# Patient Record
Sex: Female | Born: 1995 | Race: White | Hispanic: No | Marital: Single | Smoking: Never smoker
Health system: Southern US, Community
[De-identification: ages and names within clinical notes are randomized; demographics above are authoritative.]

## PROBLEM LIST (undated history)

## (undated) DIAGNOSIS — G43909 Migraine, unspecified, not intractable, without status migrainosus: Secondary | ICD-10-CM

## (undated) HISTORY — DX: Migraine, unspecified, not intractable, without status migrainosus: G43.909

---
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
## 2016-08-16 ENCOUNTER — Encounter (HOSPITAL_BASED_OUTPATIENT_CLINIC_OR_DEPARTMENT_OTHER): Payer: Self-pay

## 2016-08-16 ENCOUNTER — Emergency Department (HOSPITAL_BASED_OUTPATIENT_CLINIC_OR_DEPARTMENT_OTHER): Payer: BLUE CROSS/BLUE SHIELD

## 2016-08-16 ENCOUNTER — Emergency Department (HOSPITAL_BASED_OUTPATIENT_CLINIC_OR_DEPARTMENT_OTHER)
Admission: EM | Admit: 2016-08-16 | Discharge: 2016-08-16 | Disposition: A | Discharge status: Home / Self Care | Payer: BLUE CROSS/BLUE SHIELD | Attending: Emergency Medicine | Admitting: Emergency Medicine

## 2016-08-16 DIAGNOSIS — R1032 Left lower quadrant pain: Secondary | ICD-10-CM | POA: Diagnosis present

## 2016-08-16 DIAGNOSIS — Z79899 Other long term (current) drug therapy: Secondary | ICD-10-CM | POA: Insufficient documentation

## 2016-08-16 DIAGNOSIS — N200 Calculus of kidney: Secondary | ICD-10-CM | POA: Insufficient documentation

## 2016-08-16 LAB — CBC
HCT: 36.4 % (ref 36.0–46.0)
HEMOGLOBIN: 12.2 g/dL (ref 12.0–15.0)
MCH: 27.5 pg (ref 26.0–34.0)
MCHC: 33.5 g/dL (ref 30.0–36.0)
MCV: 82 fL (ref 78.0–100.0)
PLATELETS: 253 10*3/uL (ref 150–400)
RBC: 4.44 MIL/uL (ref 3.87–5.11)
RDW: 13.1 % (ref 11.5–15.5)
WBC: 8 10*3/uL (ref 4.0–10.5)

## 2016-08-16 LAB — URINALYSIS, MICROSCOPIC (REFLEX)

## 2016-08-16 LAB — COMPREHENSIVE METABOLIC PANEL
ALK PHOS: 49 U/L (ref 38–126)
ALT: 14 U/L (ref 14–54)
AST: 18 U/L (ref 15–41)
Albumin: 4.2 g/dL (ref 3.5–5.0)
Anion gap: 11 (ref 5–15)
BUN: 10 mg/dL (ref 6–20)
CHLORIDE: 104 mmol/L (ref 101–111)
CO2: 23 mmol/L (ref 22–32)
CREATININE: 0.82 mg/dL (ref 0.44–1.00)
Calcium: 8.9 mg/dL (ref 8.9–10.3)
GFR calc Af Amer: >60 mL/min (ref >60)
GFR calc non Af Amer: >60 mL/min (ref >60)
GLUCOSE: 131 mg/dL — AB (ref 65–99)
Potassium: 3.5 mmol/L (ref 3.5–5.1)
SODIUM: 138 mmol/L (ref 135–145)
Total Bilirubin: 0.4 mg/dL (ref 0.3–1.2)
Total Protein: 7.1 g/dL (ref 6.5–8.1)

## 2016-08-16 LAB — PREGNANCY, URINE: PREG TEST UR: NEGATIVE

## 2016-08-16 LAB — URINALYSIS, ROUTINE W REFLEX MICROSCOPIC
Bilirubin Urine: NEGATIVE
GLUCOSE, UA: NEGATIVE mg/dL
Ketones, ur: NEGATIVE mg/dL
Nitrite: NEGATIVE
PH: 5.5 (ref 5.0–8.0)
Protein, ur: NEGATIVE mg/dL
SPECIFIC GRAVITY, URINE: 1.025 (ref 1.005–1.030)

## 2016-08-16 LAB — LIPASE, BLOOD: Lipase: 30 U/L (ref 11–51)

## 2016-08-16 MED ORDER — HYDROCODONE-ACETAMINOPHEN 5-325 MG PO TABS: 1.0000 | ORAL_TABLET | Freq: Four times a day (QID) | ORAL | 0 refills | Status: DC | PRN

## 2016-08-16 MED ORDER — KETOROLAC TROMETHAMINE 30 MG/ML IJ SOLN
30.0000 mg | Freq: Once | INTRAMUSCULAR | Status: AC
  Administered 2016-08-16: 30 mg via INTRAVENOUS

## 2016-08-16 MED ORDER — IBUPROFEN 800 MG PO TABS: 800.0000 mg | ORAL_TABLET | Freq: Three times a day (TID) | ORAL | 0 refills | Status: DC | PRN

## 2016-08-16 MED ORDER — SODIUM CHLORIDE 0.9 % IV BOLUS (SEPSIS)
1000.0000 mL | Freq: Once | INTRAVENOUS | Status: AC
Start: 2016-08-16 — End: 2016-08-16
  Administered 2016-08-16: 1000 mL via INTRAVENOUS

## 2016-08-16 MED ORDER — TAMSULOSIN HCL 0.4 MG PO CAPS: 0.4000 mg | ORAL_CAPSULE | Freq: Every day | ORAL | 0 refills | Status: DC

## 2016-08-16 MED ORDER — ONDANSETRON HCL 4 MG/2ML IJ SOLN
4.0000 mg | Freq: Once | INTRAMUSCULAR | Status: AC
  Administered 2016-08-16: 4 mg via INTRAVENOUS

## 2016-08-16 MED ORDER — ONDANSETRON 4 MG PO TBDP: 4.0000 mg | ORAL_TABLET | Freq: Three times a day (TID) | ORAL | 0 refills | Status: DC | PRN

## 2016-08-16 NOTE — ED Notes (Signed)
Pt's mother came to desk to state pt was nauseated

## 2016-08-16 NOTE — ED Triage Notes (Signed)
Pt c/o left lower abdominal pain that woke her from sleep, denies dysuria, denies n/v/d

## 2016-08-16 NOTE — ED Notes (Signed)
Pt verbalizes understanding of d/c instructions and denies any further needs at this time. 

## 2016-08-16 NOTE — Discharge Instructions (Signed)
You are being provided a prescription for opiates (also known as narcotics) for pain control.  Opiates can be addictive and should only be used when absolutely necessary for pain control when other alternatives do not work.  We recommend you only use them for the recommended amount of time and only as prescribed.  Please do not take with other sedative medications or alcohol.  Please do not drive, operate machinery, make important decisions while taking opiates.  Please note that these medications can be addictive and have high abuse potential.  Please keep these medications locked away from children, teenagers or any family members with history of substance abuse.   ° ° ° °

## 2016-08-16 NOTE — ED Provider Notes (Signed)
TIME SEEN: 2:52 AM  CHIEF COMPLAINT: Left-sided abdominal pain, left flank pain  HPI: Patient is a 21 year old female with a significant past medical history who presents to the emergency department with sudden onset left mid abdominal pain and left flank pain that she describes as sharp and constant that woke her from sleep at 1:30 AM. No aggravating or relieving factors. Has never had similar symptoms. She does have a twin sister who has a history of kidney stones she has never had kidney stones. She has had nausea but no vomiting or diarrhea. No vaginal bleeding, discharge, dysuria or hematuria. She is not sure of her last menstrual period. No history of abdominal surgeries. No history of pyelonephritis.  ROS: See HPI Constitutional: no fever  Eyes: no drainage  ENT: no runny nose   Cardiovascular:  no chest pain  Resp: no SOB  GI: no vomiting GU: no dysuria Integumentary: no rash  Allergy: no hives  Musculoskeletal: no leg swelling  Neurological: no slurred speech ROS otherwise negative  PAST MEDICAL HISTORY/PAST SURGICAL HISTORY:  Past Medical History:  Diagnosis Date  . Premenopausal menorrhagia 11/14/2012    MEDICATIONS:  Prior to Admission medications   Medication Sig Start Date End Date Taking? Authorizing Provider  venlafaxine XR (EFFEXOR-XR) 75 MG 24 hr capsule Take 75 mg by mouth daily. 04/12/16   [provider]    ALLERGIES:  No Known Allergies  SOCIAL HISTORY:  Social History  Substance Use Topics  . Smoking status: Never Smoker  . Smokeless tobacco: Never Used  . Alcohol use No    FAMILY HISTORY: Family History  Problem Relation Age of Onset  . Hyperlipidemia Father   . Cancer Maternal Grandfather        colon  . Asthma Paternal Grandmother   . Heart disease Paternal Grandmother   . Hypertension Paternal Grandmother   . Depression Paternal Grandmother   . Heart disease Paternal Grandfather   . Hypertension Paternal Grandfather      EXAM: BP 111/72 (BP Location: Left Arm)   Pulse 69   Temp 98.7 F (37.1 C) (Oral)   Resp 18   Ht 5\' 3"  (1.6 m)   Wt 49.9 kg (110 lb)   SpO2 100%   BMI 19.49 kg/m  CONSTITUTIONAL: Alert and oriented and responds appropriately to questions. Patient appears uncomfortable but is afebrile nontoxic-appearing, well-hydrated HEAD: Normocephalic EYES: Conjunctivae clear, pupils appear equal, EOMI ENT: normal nose; moist mucous membranes NECK: Supple, no meningismus, no nuchal rigidity, no LAD  CARD: RRR; S1 and S2 appreciated; no murmurs, no clicks, no rubs, no gallops RESP: Normal chest excursion without splinting or tachypnea; breath sounds clear and equal bilaterally; no wheezes, no rhonchi, no rales, no hypoxia or respiratory distress, speaking full sentences ABD/GI: Normal bowel sounds; non-distended; soft, tender in the left mid abdomen but no tenderness in the pelvic region, no tenderness at McBurney's point, negative Murphy sign, no rebound, no guarding, no peritoneal signs, no hepatosplenomegaly BACK:  The back appears normal and there is some left CVA tenderness but no midline spinal tenderness or step-off or deformity, no erythema or warmth, no ecchymosis EXT: Normal ROM in all joints; non-tender to palpation; no edema; normal capillary refill; no cyanosis, no calf tenderness or swelling    SKIN: Normal color for age and race; warm; no rash NEURO: Moves all extremities equally PSYCH: The patient's mood and manner are appropriate. Grooming and personal hygiene are appropriate.  MEDICAL DECISION MAKING: Patient here with sudden onset pain.  Suspect kidney stone. Differential also includes pyelonephritis, colitis, diverticulitis, pancreatitis. I feel is less likely appendicitis or cholecystitis. We'll obtain labs, urine and CT scan for further evaluation. Will give IV fluids, Toradol, Zofran for symptomatic relief.  ED PROGRESS: 3:00 AM  Patient's pregnancy test is negative. Her urine  shows a large amount of blood. She does have bacteria but also many squamous cells. Labs, CT scan pending.   4:10 AM  Pt's pain almost completely gone with IV Toradol. She does not want any further pain medication at this time. CT scan shows an obstructive 3 mm stone within the distal left ureter with mild left hydroureteronephrosis. No other acute process noted. Appendix is normal. I feel she is safe to be discharged home. We'll give outpatient urology follow-up, discharge with urine strainer. We'll discharge with prescriptions for ibuprofen, Zofran, Vicodin, Flomax. Discussed return precautions. Patient and mother verbalized understanding and are comfortable with this plan.   At this time, I do not feel there is any life-threatening condition present. I have reviewed and discussed all results (EKG, imaging, lab, urine as appropriate) and exam findings with patient/family. I have reviewed nursing notes and appropriate previous records.  I feel the patient is safe to be discharged home without further emergent workup and can continue workup as an outpatient as needed. Discussed usual and customary return precautions. Patient/family verbalize understanding and are comfortable with this plan.  Outpatient follow-up has been provided if needed. All questions have been answered.      Chetara Kropp, Layla Maw, DO 08/16/16 [id number]

## 2016-08-16 NOTE — ED Notes (Signed)
Patient transported to CT 

## 2016-08-16 NOTE — ED Notes (Signed)
Pt returned from CT °

## 2017-10-30 IMAGING — CT CT RENAL STONE PROTOCOL
2 of 4 series · 16 of 46 positions shown, 18 images · non-contrast
Comparison: None available.

CLINICAL DATA: Initial evaluation for acute flank pain.

EXAM:
CT ABDOMEN AND PELVIS WITHOUT CONTRAST
TECHNIQUE: Multidetector CT imaging of the abdomen and pelvis was performed
following the standard protocol without IV contrast.

[Series 2: axial st · axial · 0.63mm/px · z∈[-478,-58]mm · 13 of 93 slices shown, 15 images]
[im 5/93  soft-tissue]
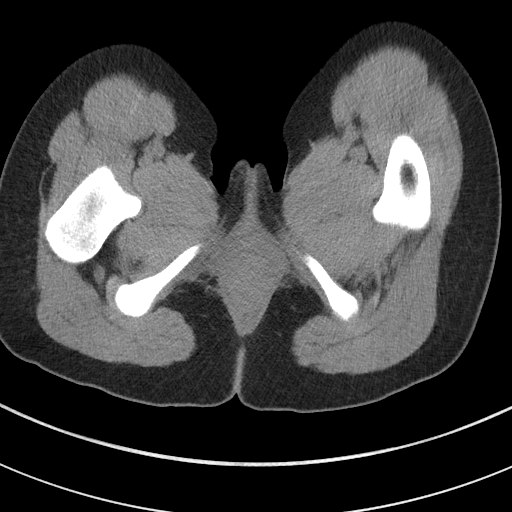
[im 5/93  bone]
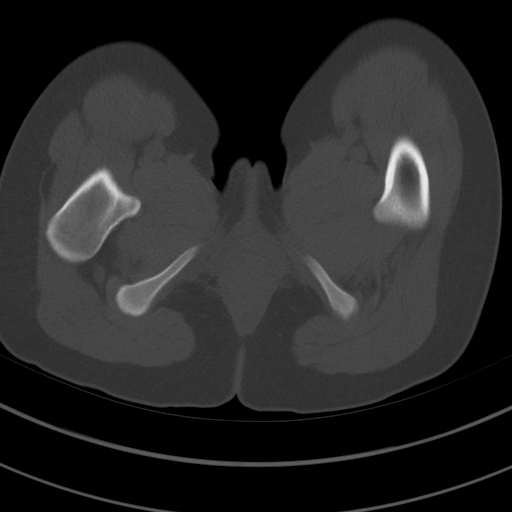
[im 13/93  soft-tissue]
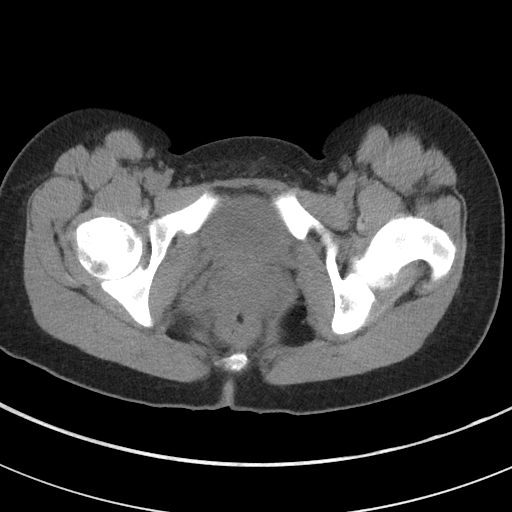
[im 21/93  soft-tissue]
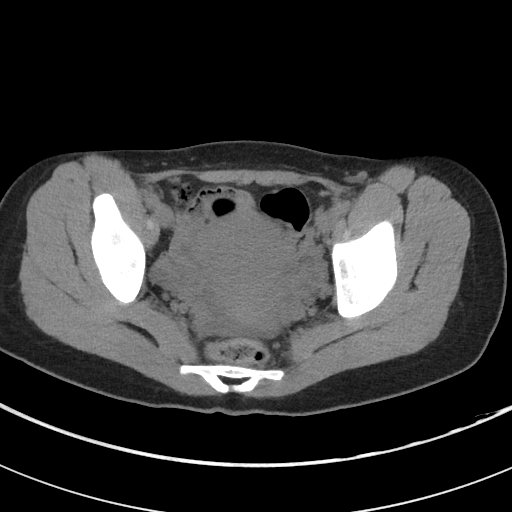
[im 25/93  soft-tissue]
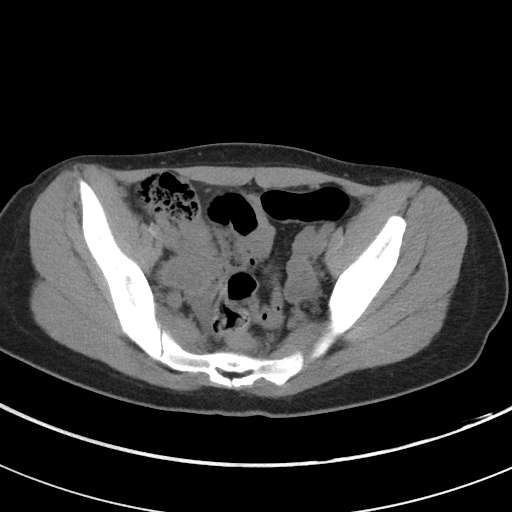
[im 33/93  soft-tissue]
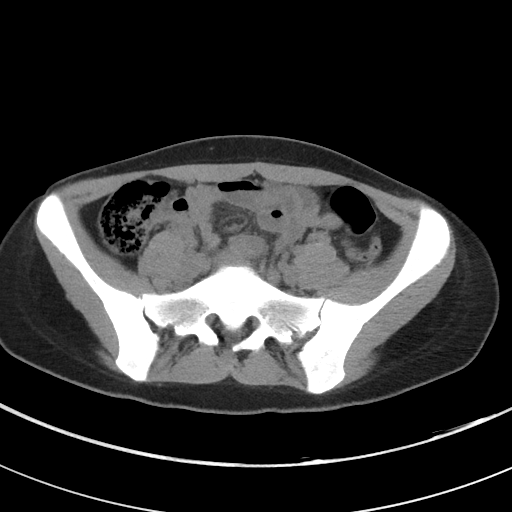
[im 41/93  soft-tissue]
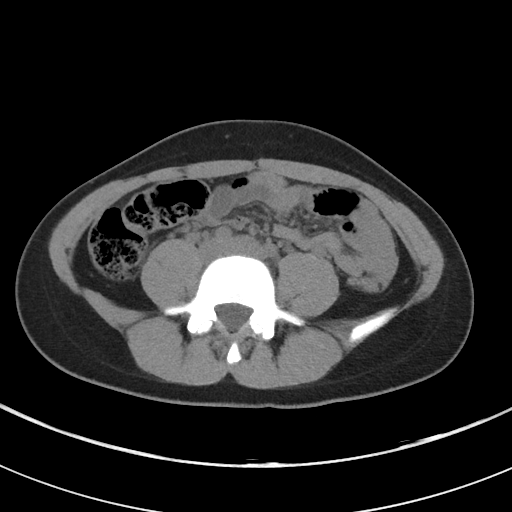
[im 49/93  soft-tissue]
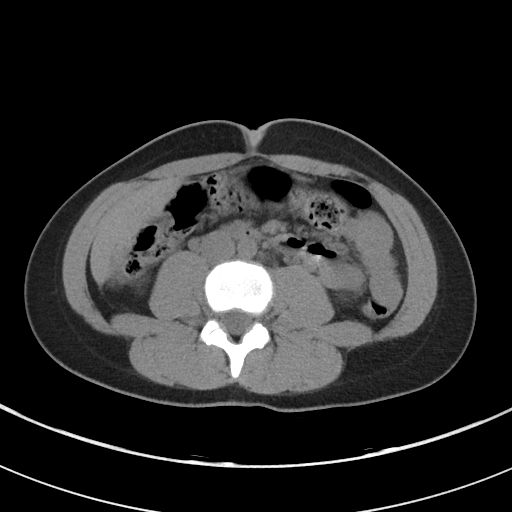
[im 53/93  soft-tissue]
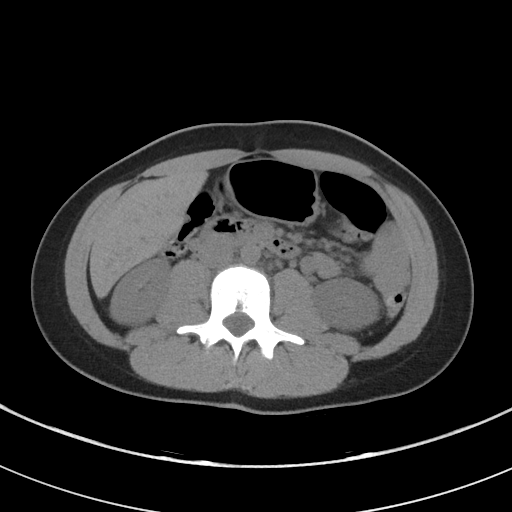
[im 61/93  soft-tissue]
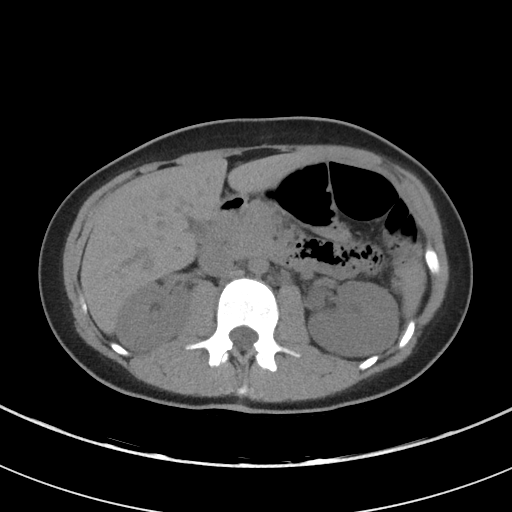
[im 61/93  bone]
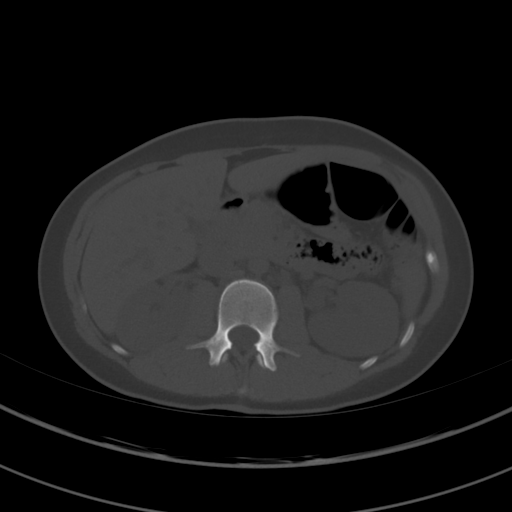
[im 69/93  soft-tissue]
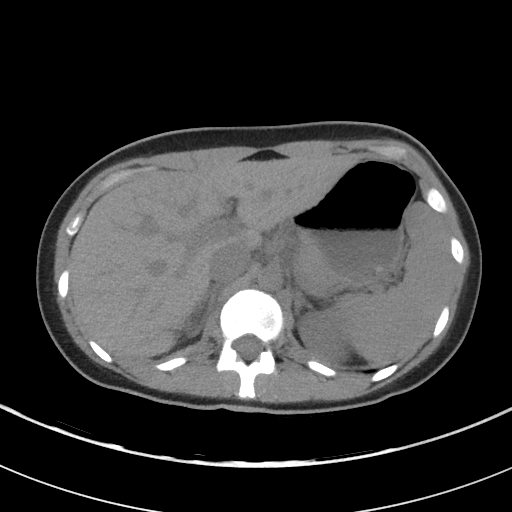
[im 73/93  soft-tissue]
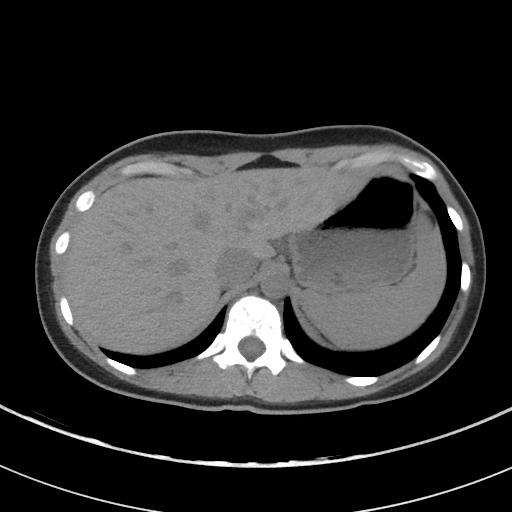
[im 81/93  soft-tissue]
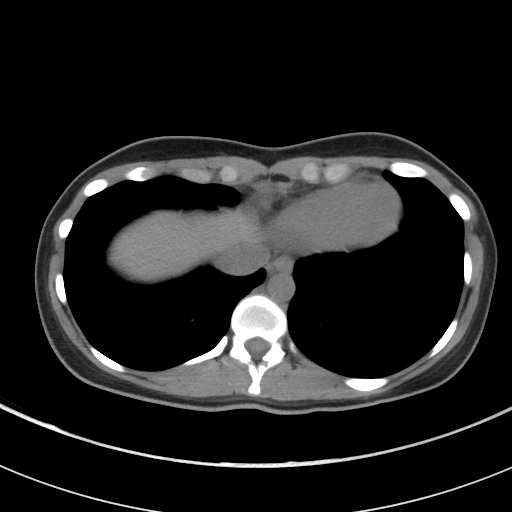
[im 89/93  soft-tissue]
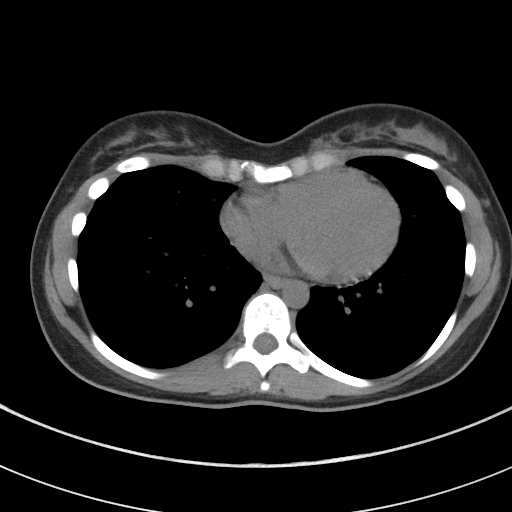

[Series 4: coronal st · coronal · 0.87mm/px · 3 of 61 slices shown]
[im 21/61  soft-tissue]
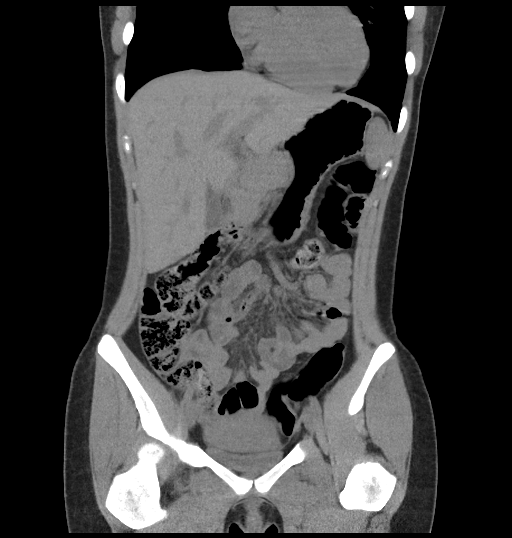
[im 27/61  soft-tissue]
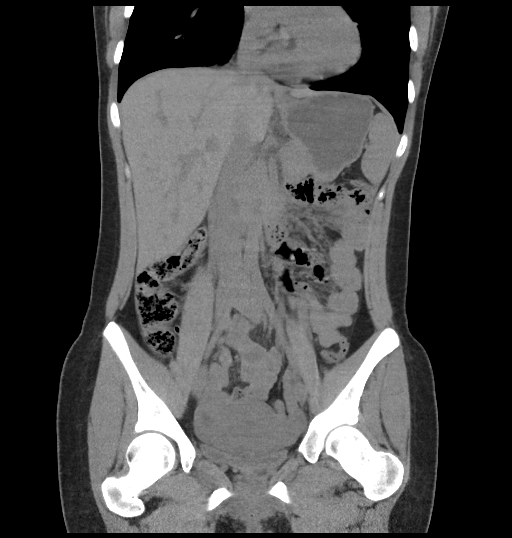
[im 34/61  soft-tissue]
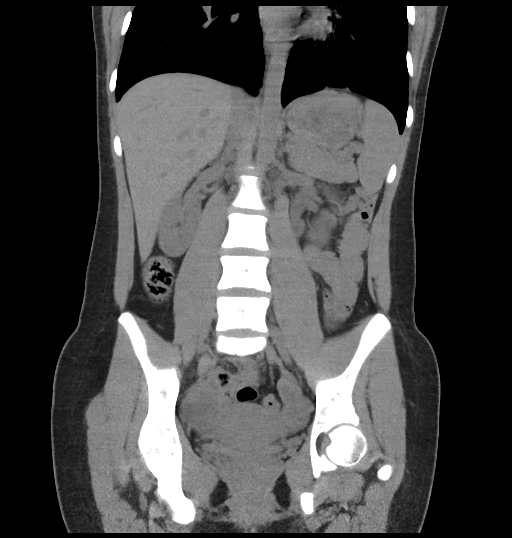

[16 of 46 positions shown; findings below may reference images not displayed]

FINDINGS: Lower chest: Visualized lung bases are clear.

Hepatobiliary: Limited noncontrast evaluation of the liver is
unremarkable. Gallbladder within normal limits. No biliary
dilatation.

Pancreas: Pancreas within normal limits.

Spleen: Spleen within normal limits.

Adrenals/Urinary Tract: Adrenal glands are normal.

On the left, there is mild left hydroureteronephrosis. Punctate 3 mm
calcification overlying the expected location of the distal left
ureter likely reflects a small obstructive stone (series 2, image
77). No other definite radiopaque calculi identified within the left
ureter. Faint punctate nonobstructive calculi in noted within the
mid and upper left kidney.

Right kidney within normal limits without evidence for
nephrolithiasis or hydronephrosis. No radiopaque calculi seen along
the expected course of the right renal collecting system. No
right-sided hydroureter.

Bladder largely decompressed. No layering stones within the bladder
lumen.

Stomach/Bowel: Stomach within normal limits. No evidence for bowel
obstruction. No acute inflammatory changes seen about the bowels.
Appendix within normal limits.

Vascular/Lymphatic: Intra-abdominal aorta of normal caliber. No
adenopathy.

Reproductive: Uterus and ovaries within normal limits.

Other: No free air or fluid.

Musculoskeletal: No acute osseus abnormality. No worrisome lytic or
blastic osseous lesions.
IMPRESSION: 1. Obstructive 3 mm stone within the distal left ureter with
secondary mild left hydroureteronephrosis.
2. Faint nonobstructive left renal nephrolithiasis.
3. No other acute intra-abdominal or pelvic process.

## 2018-03-07 ENCOUNTER — Ambulatory Visit: Payer: BLUE CROSS/BLUE SHIELD | Admitting: Family Medicine

## 2018-03-07 ENCOUNTER — Encounter: Payer: Self-pay | Admitting: Family Medicine

## 2018-03-07 ENCOUNTER — Other Ambulatory Visit: Payer: Self-pay

## 2018-03-07 VITALS — BP 118/74 | HR 88 | Temp 98.5°F | Ht 63.75 in | Wt 111.0 lb

## 2018-03-07 DIAGNOSIS — L603 Nail dystrophy: Secondary | ICD-10-CM

## 2018-03-07 DIAGNOSIS — G43009 Migraine without aura, not intractable, without status migrainosus: Secondary | ICD-10-CM

## 2018-03-07 DIAGNOSIS — Z87442 Personal history of urinary calculi: Secondary | ICD-10-CM | POA: Diagnosis not present

## 2018-03-07 MED ORDER — RIZATRIPTAN BENZOATE 10 MG PO TBDP: ORAL_TABLET | ORAL | 0 refills | Status: DC

## 2018-03-07 NOTE — Patient Instructions (Signed)
We will set up Podiatry referral.

## 2018-03-07 NOTE — Progress Notes (Signed)
  Subjective:     Patient ID: Candice Rowe, female   DOB: February 01, 1995, 23 y.o.   MRN: [id number]  HPI Patient seen to establish care  Generally very healthy.  She did have kidney stone back in 2018 but none since then.  No analysis of the stone was done.  She has history of migraine headaches.  These are usually unilateral and associated with some nausea and photosensitivity.  No clear provoking factors but possibly related to stress or change in sleep pattern.  No history of aura.  She is basically not taking any medications because of difficulty swallowing in the past.  She does try to sleep these all frequently which seems to help.  Usually less than 3/month.  She has dystrophic great toe nail bilaterally.  No known history of trauma.  She was concerned about a possible "fungal "infection.  Occasionally painful.  Takes no regular medications.  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 Premature birth    pt has a twin, born at 35 weeks   History reviewed. No pertinent surgical history.  reports that she has never smoked. She has never used smokeless tobacco. She reports current alcohol use of about 1.0 standard drinks of alcohol per week. She reports that she does not use drugs. family history is not on file. Allergies  Allergen Reactions  . Dust Mite Extract     Watery eyes, sneezing, hives if patient does not wash hands. Also has pet dander allergy     Review of Systems  Constitutional: Negative for appetite change and unexpected weight change.  Respiratory: Negative for shortness of breath.   Cardiovascular: Negative for chest pain.  Gastrointestinal: Negative for abdominal pain.  Neurological: Negative for headaches.  Hematological: Negative for adenopathy.       Objective:   Physical Exam Constitutional:      Appearance: Normal  appearance.  Cardiovascular:     Rate and Rhythm: Normal rate and regular rhythm.     Heart sounds: No murmur.  Pulmonary:     Effort: Pulmonary effort is normal.     Breath sounds: Normal breath sounds. No wheezing or rales.  Skin:    Comments: He has very thickened dystrophic great toenails bilaterally.  No brittle changes.  No surrounding erythema  Neurological:     Mental Status: She is alert.        Assessment:     #1 history of migraine headache without aura.  #2 dystrophic great toenails bilaterally.    Plan:     -She has difficulty swallowing and we have suggested that she try Maxalt MLT 10 mg 1 at onset of migraine and may repeat 1 in 2 hours as needed but maximum of 2 in 24 hours -Discussed potential triggers for migraine No clear indication for prophylaxis at this point unless they become more frequent. -Set up podiatry referral  Kristian Covey MD [facility]

## 2018-03-14 ENCOUNTER — Ambulatory Visit: Payer: BLUE CROSS/BLUE SHIELD | Admitting: Podiatry

## 2018-03-14 ENCOUNTER — Encounter: Payer: Self-pay | Admitting: Podiatry

## 2018-03-14 DIAGNOSIS — B351 Tinea unguium: Secondary | ICD-10-CM

## 2018-03-19 NOTE — Progress Notes (Signed)
   HPI: 23 year old female presenting today as a new patient with a chief complaint of thickened, discolored great toenails bilaterally that have been present for over a year. She denies any modifying factors and has not had any treatment for the symptoms. Patient is here for further evaluation and treatment.   Past Medical History:  Diagnosis Date  . Migraines   . Premature birth    pt has a twin, born at 29 weeks     Physical Exam: General: The patient is alert and oriented x3 in no acute distress.  Dermatology: Hyperkeratotic, discolored, thickened, onychodystrophy of the bilateral great toenails. Skin is warm, dry and supple bilateral lower extremities. Negative for open lesions or macerations.  Vascular: Palpable pedal pulses bilaterally. No edema or erythema noted. Capillary refill within normal limits.  Neurological: Epicritic and protective threshold grossly intact bilaterally.   Musculoskeletal Exam: Range of motion within normal limits to all pedal and ankle joints bilateral. Muscle strength 5/5 in all groups bilateral.   Assessment: 1. Dystrophic nails bilateral hallux   Plan of Care:  1. Patient evaluated.  2. Today a nail biopsy was taken and sent to pathology for fungal culture. 3. Return to clinic in 3 weeks to review biopsy results.   Pronounced "Anya". Going to [employer] in 2 weeks.     Felecia Shelling, DPM [facility]  Dr. Felecia Shelling, DPM    [address] [place], [place] [address]                Office [phone]  Fax [phone]

## 2018-04-11 ENCOUNTER — Telehealth: Payer: Self-pay | Admitting: *Deleted

## 2018-04-11 ENCOUNTER — Other Ambulatory Visit: Payer: Self-pay

## 2018-04-11 ENCOUNTER — Telehealth (INDEPENDENT_AMBULATORY_CARE_PROVIDER_SITE_OTHER): Payer: BLUE CROSS/BLUE SHIELD | Admitting: Podiatry

## 2018-04-11 DIAGNOSIS — L603 Nail dystrophy: Secondary | ICD-10-CM | POA: Diagnosis not present

## 2018-04-11 NOTE — Telephone Encounter (Signed)
Candice Rowe request insurance information for pt. Left message infaorming [name] I would fax pt's insurance informantion and to the main [place] fax. Faxed insurance and demographics to [place].

## 2018-04-11 NOTE — Progress Notes (Addendum)
   HPI: 23 year old female presents via virtual office visit for follow-up evaluation regarding dystrophic toenails to the bilateral great toes. Patient consents for virtual office visit.  Patient has had thickened disfigured toenails for several years now.  Last visit on 03/14/2018 nail biopsy was performed and sent to pathology.  Patient presents to review the biopsy results and discuss further treatment options  Past Medical History:  Diagnosis Date  . Migraines   . Premature birth    pt has a twin, born at 73 weeks   Nail biopsy pathology report 03/14/2018: - Pathologic nailbed keratinization - Subungual bacterial colonization - Onychoschizia (pathologic splitting of nail plate) - PAS reaction and GMS stains failed to demonstrate fungal elements - A Fontana-Masson stain fails to demonstrate melanin pigment  Assessment: 1.  Onychodystrophy bilateral great toenails 2.  Negative for onychomycosis   Plan of Care:  1. Patient evaluated.  No biopsy results were reviewed.  2.  Recommend urea 40% topical to apply the nail to soften as it grows out 3.  I did explain to the patient that this is a hard deformity to treat.  Sometimes nail dystrophy may be permanent.  Recommend good loosefitting shoe gear 4.  Return to clinic or follow-up E visit as needed      Felecia Shelling, DPM [facility]  Dr. Felecia Shelling, DPM    [address] [place], [place] [address]                Office [phone]  Fax [phone]

## 2019-11-19 ENCOUNTER — Other Ambulatory Visit: Payer: Self-pay

## 2019-11-19 ENCOUNTER — Ambulatory Visit (INDEPENDENT_AMBULATORY_CARE_PROVIDER_SITE_OTHER): Payer: Managed Care, Other (non HMO) | Admitting: Family Medicine

## 2019-11-19 ENCOUNTER — Encounter: Payer: Self-pay | Admitting: Family Medicine

## 2019-11-19 VITALS — BP 108/60 | HR 72 | Temp 98.2°F | Ht 63.75 in | Wt 98.2 lb

## 2019-11-19 DIAGNOSIS — Z23 Encounter for immunization: Secondary | ICD-10-CM | POA: Diagnosis not present

## 2019-11-19 DIAGNOSIS — Z0000 Encounter for general adult medical examination without abnormal findings: Secondary | ICD-10-CM | POA: Diagnosis not present

## 2019-11-19 LAB — CBC WITH DIFFERENTIAL/PLATELET
Basophils Absolute: 48 cells/uL (ref 0–200)
Eosinophils Absolute: 90 cells/uL (ref 15–500)
HCT: 46.1 % — ABNORMAL HIGH (ref 35.0–45.0)
MCHC: 32.3 g/dL (ref 32.0–36.0)
Neutrophils Relative %: 62.6 %

## 2019-11-19 NOTE — Progress Notes (Signed)
Established Patient Office Visit  Subjective:  Patient ID: Candice Rowe, female    DOB: 05-Sep-1995  Age: 24 y.o. MRN: [id number]  CC:  Chief Complaint  Patient presents with  . Annual Exam    physical with any concerns,    HPI Candice Rowe presents for physical exam.  She has history of kidney stones but fortunately none recently.  She also has history of migraine headaches but none recently.  She graduated from [employer] last year.  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  Currently takes no regular medications.  We did know that she had lost some weight from 111 pounds 2020 to current weight of 98 pounds.  She was unaware of this.  She denies any history of bulimia or anorexia.  She states that frequently she gets busy and forgets to eat.  She states that she frequently is not hungry.  She is actually trying to gain some weight.  Health maintenance reviewed  -No history of hepatitis C screening.  She is low risk. -She has had Covid vaccine.  Considering booster soon. -Needs flu vaccine and would like to get that today -She thinks she had tetanus in her early 19s but is not totally sure and she will try to confirm -She had Pap smear just last week.  She is not sexually active.  Social history-single.  Graduated from [employer] last year.  Non-smoker.  No regular alcohol use.  Family history-type 2 diabetes in father.  Mom has had prior stroke.  Hypertension history in mother.  Mom has history of pulmonary embolism and also history of astrocytoma of the brain  Past Medical History:  Diagnosis Date  . Migraines   . Premature birth    pt has a twin, born at 30 weeks    History reviewed. No pertinent surgical history.  Family History  Problem Relation Age of Onset  . Cancer Mother   . Hypertension Mother   . Stroke Mother   . Hyperlipidemia Father     Social History   Socioeconomic History  . Marital status: Unknown    Spouse name: Not on file   . Number of children: Not on file  . Years of education: Not on file  . Highest education level: Not on file  Occupational History  . Not on file  Tobacco Use  . Smoking status: Never Smoker  . Smokeless tobacco: Never Used  Vaping Use  . Vaping Use: Never used  Substance and Sexual Activity  . Alcohol use: Yes    Alcohol/week: 1.0 standard drink    Types: 1 Shots of liquor per week    Comment: Occasionally  . Drug use: Never  . Sexual activity: Never  Other Topics Concern  . Not on file  Social History Narrative  . Not on file   Social Determinants of Health   Financial Resource Strain:   . Difficulty of Paying Living Expenses: Not on file  Food Insecurity:   . Worried About Programme researcher, broadcasting/film/video in the Last Year: Not on file  . Ran Out of Food in the Last Year: Not on file  Transportation Needs:   . Lack of Transportation (Medical): Not on file  . Lack of Transportation (Non-Medical): Not on file  Physical Activity:   . Days of Exercise per Week: Not on file  . Minutes of Exercise per Session: Not on file  Stress:   . Feeling of Stress : Not on  file  Social Connections:   . Frequency of Communication with Friends and Family: Not on file  . Frequency of Social Gatherings with Friends and Family: Not on file  . Attends Religious Services: Not on file  . Active Member of Clubs or Organizations: Not on file  . Attends Banker Meetings: Not on file  . Marital Status: Not on file  Intimate Partner Violence:   . Fear of Current or Ex-Partner: Not on file  . Emotionally Abused: Not on file  . Physically Abused: Not on file  . Sexually Abused: Not on file    Outpatient Medications Prior to Visit  Medication Sig Dispense Refill  . rizatriptan (MAXALT-MLT) 10 MG disintegrating tablet Take one at onset of migraine and may repeat one in 2 hours as needed .    Max of 2 in 24 hours. 10 tablet 0   No facility-administered medications prior to visit.     Allergies  Allergen Reactions  . Dust Mite Extract     Watery eyes, sneezing, hives if patient does not wash hands. Also has pet dander allergy    ROS Review of Systems  Constitutional: Negative for activity change, appetite change, fatigue, fever and unexpected weight change.  HENT: Negative for ear pain, hearing loss, sore throat and trouble swallowing.   Eyes: Negative for visual disturbance.  Respiratory: Negative for cough and shortness of breath.   Cardiovascular: Negative for chest pain and palpitations.  Gastrointestinal: Negative for abdominal pain, blood in stool, constipation and diarrhea.  Genitourinary: Negative for dysuria and hematuria.  Musculoskeletal: Negative for arthralgias, back pain and myalgias.  Skin: Negative for rash.  Neurological: Negative for dizziness, syncope and headaches.  Hematological: Negative for adenopathy.  Psychiatric/Behavioral: Negative for confusion and dysphoric mood.      Objective:    Physical Exam Vitals reviewed.  HENT:     Right Ear: Tympanic membrane normal.     Left Ear: Tympanic membrane normal.  Cardiovascular:     Rate and Rhythm: Normal rate.     Comments: Rhythm is regular but she does have some mild tachycardia.  She thinks this may be more anxiety.  She is not aware of persistent elevated pulse Pulmonary:     Effort: Pulmonary effort is normal.     Breath sounds: Normal breath sounds.  Abdominal:     Palpations: Abdomen is soft.     Tenderness: There is no abdominal tenderness.  Musculoskeletal:     Cervical back: Neck supple.     Right lower leg: No edema.     Left lower leg: No edema.  Lymphadenopathy:     Cervical: No cervical adenopathy.  Neurological:     General: No focal deficit present.     Mental Status: She is alert.     Cranial Nerves: No cranial nerve deficit.     BP 108/60 (BP Location: Left Arm, Patient Position: Sitting, Cuff Size: Normal)   Pulse 72   Temp 98.2 F (36.8 C) (Oral)    Ht 5' 3.75" (1.619 m)   Wt 98 lb 3.2 oz (44.5 kg)   LMP 10/16/2019   SpO2 98%   BMI 16.99 kg/m  Wt Readings from Last 3 Encounters:  11/19/19 98 lb 3.2 oz (44.5 kg)  03/07/18 111 lb (50.3 kg)  08/16/16 110 lb (49.9 kg)     Health Maintenance Due  Topic Date Due  . Hepatitis C Screening  Never done  . HIV Screening  Never done  .  TETANUS/TDAP  Never done  . INFLUENZA VACCINE  08/11/2019    There are no preventive care reminders to display for this patient.  No results found for: TSH Lab Results  Component Value Date   WBC 8.0 08/16/2016   HGB 12.2 08/16/2016   HCT 36.4 08/16/2016   MCV 82.0 08/16/2016   PLT 253 08/16/2016   Lab Results  Component Value Date   NA 138 08/16/2016   K 3.5 08/16/2016   CO2 23 08/16/2016   GLUCOSE 131 (H) 08/16/2016   BUN 10 08/16/2016   CREATININE 0.82 08/16/2016   BILITOT 0.4 08/16/2016   ALKPHOS 49 08/16/2016   AST 18 08/16/2016   ALT 14 08/16/2016   PROT 7.1 08/16/2016   ALBUMIN 4.2 08/16/2016   CALCIUM 8.9 08/16/2016   ANIONGAP 11 08/16/2016   No results found for: CHOL No results found for: HDL No results found for: LDLCALC No results found for: TRIG No results found for: CHOLHDL No results found for: DCVU1T    Assessment & Plan:   Problem List Items Addressed This Visit    None    Visit Diagnoses    Physical exam    -  Primary   Relevant Orders   Basic metabolic panel   Lipid panel   CBC with Differential/Platelet   TSH   Hepatic function panel   Hep C Antibody    Generally healthy.  She is underweight for height and has had some unintentional weight loss since last visit.  She states her appetite for the most part is decent.  Sometimes skips meals.  No history of bulimia or anorexia  -Check screening labs.  Include hepatitis C antibody.  Include TSH. -Flu vaccine given -Discussed healthy strategies to try to gain weight. -She will consider getting her Covid booster soon  No orders of the defined types  were placed in this encounter.   Follow-up: No follow-ups on file.    Evelena Peat, MD

## 2019-11-19 NOTE — Patient Instructions (Signed)
Preventive Care 21-25 Years Old, Female Preventive care refers to visits with your health care provider and lifestyle choices that can promote health and wellness. This includes:  A yearly physical exam. This may also be called an annual well check.  Regular dental visits and eye exams.  Immunizations.  Screening for certain conditions.  Healthy lifestyle choices, such as eating a healthy diet, getting regular exercise, not using drugs or products that contain nicotine and tobacco, and limiting alcohol use. What can I expect for my preventive care visit? Physical exam Your health care provider will check your:  Height and weight. This may be used to calculate body mass index (BMI), which tells if you are at a healthy weight.  Heart rate and blood pressure.  Skin for abnormal spots. Counseling Your health care provider may ask you questions about your:  Alcohol, tobacco, and drug use.  Emotional well-being.  Home and relationship well-being.  Sexual activity.  Eating habits.  Work and work environment.  Method of birth control.  Menstrual cycle.  Pregnancy history. What immunizations do I need?  Influenza (flu) vaccine  This is recommended every year. Tetanus, diphtheria, and pertussis (Tdap) vaccine  You may need a Td booster every 10 years. Varicella (chickenpox) vaccine  You may need this if you have not been vaccinated. Human papillomavirus (HPV) vaccine  If recommended by your health care provider, you may need three doses over 6 months. Measles, mumps, and rubella (MMR) vaccine  You may need at least one dose of MMR. You may also need a second dose. Meningococcal conjugate (MenACWY) vaccine  One dose is recommended if you are age 19-21 years and a first-year college student living in a residence hall, or if you have one of several medical conditions. You may also need additional booster doses. Pneumococcal conjugate (PCV13) vaccine  You may need  this if you have certain conditions and were not previously vaccinated. Pneumococcal polysaccharide (PPSV23) vaccine  You may need one or two doses if you smoke cigarettes or if you have certain conditions. Hepatitis A vaccine  You may need this if you have certain conditions or if you travel or work in places where you may be exposed to hepatitis A. Hepatitis B vaccine  You may need this if you have certain conditions or if you travel or work in places where you may be exposed to hepatitis B. Haemophilus influenzae type b (Hib) vaccine  You may need this if you have certain conditions. You may receive vaccines as individual doses or as more than one vaccine together in one shot (combination vaccines). Talk with your health care provider about the risks and benefits of combination vaccines. What tests do I need?  Blood tests  Lipid and cholesterol levels. These may be checked every 5 years starting at age 20.  Hepatitis C test.  Hepatitis B test. Screening  Diabetes screening. This is done by checking your blood sugar (glucose) after you have not eaten for a while (fasting).  Sexually transmitted disease (STD) testing.  BRCA-related cancer screening. This may be done if you have a family history of breast, ovarian, tubal, or peritoneal cancers.  Pelvic exam and Pap test. This may be done every 3 years starting at age 21. Starting at age 30, this may be done every 5 years if you have a Pap test in combination with an HPV test. Talk with your health care provider about your test results, treatment options, and if necessary, the need for more tests.   Follow these instructions at home: Eating and drinking   Eat a diet that includes fresh fruits and vegetables, whole grains, lean protein, and low-fat dairy.  Take vitamin and mineral supplements as recommended by your health care provider.  Do not drink alcohol if: ? Your health care provider tells you not to drink. ? You are  pregnant, may be pregnant, or are planning to become pregnant.  If you drink alcohol: ? Limit how much you have to 0-1 drink a day. ? Be aware of how much alcohol is in your drink. In the [place] one drink equals one 12 oz bottle of beer (355 mL), one 5 oz glass of wine (148 mL), or one 1 oz glass of hard liquor (44 mL). Lifestyle  Take daily care of your teeth and gums.  Stay active. Exercise for at least 30 minutes on 5 or more days each week.  Do not use any products that contain nicotine or tobacco, such as cigarettes, e-cigarettes, and chewing tobacco. If you need help quitting, ask your health care provider.  If you are sexually active, practice safe sex. Use a condom or other form of birth control (contraception) in order to prevent pregnancy and STIs (sexually transmitted infections). If you plan to become pregnant, see your health care provider for a preconception visit. What's next?  Visit your health care provider once a year for a well check visit.  Ask your health care provider how often you should have your eyes and teeth checked.  Stay up to date on all vaccines. This information is not intended to replace advice given to you by your health care provider. Make sure you discuss any questions you have with your health care provider. Document Revised: 09/07/2017 Document Reviewed: 09/07/2017 Elsevier Patient Education  2020 [employer].

## 2019-11-20 LAB — BASIC METABOLIC PANEL
BUN: 14 mg/dL (ref 7–25)
CO2: 23 mmol/L (ref 20–32)
Calcium: 10.2 mg/dL (ref 8.6–10.2)
Chloride: 105 mmol/L (ref 98–110)
Creat: 0.89 mg/dL (ref 0.50–1.10)
Glucose, Bld: 81 mg/dL (ref 65–99)
Potassium: 4.4 mmol/L (ref 3.5–5.3)
Sodium: 141 mmol/L (ref 135–146)

## 2019-11-20 LAB — CBC WITH DIFFERENTIAL/PLATELET
Absolute Monocytes: 339 cells/uL (ref 200–950)
Basophils Relative: 0.9 %
Eosinophils Relative: 1.7 %
Hemoglobin: 14.9 g/dL (ref 11.7–15.5)
Lymphs Abs: 1505 cells/uL (ref 850–3900)
MCH: 27.4 pg (ref 27.0–33.0)
MCV: 84.9 fL (ref 80.0–100.0)
MPV: 11 fL (ref 7.5–12.5)
Monocytes Relative: 6.4 %
Neutro Abs: 3318 cells/uL (ref 1500–7800)
Platelets: 284 10*3/uL (ref 140–400)
RBC: 5.43 10*6/uL — ABNORMAL HIGH (ref 3.80–5.10)
RDW: 12.5 % (ref 11.0–15.0)
Total Lymphocyte: 28.4 %
WBC: 5.3 10*3/uL (ref 3.8–10.8)

## 2019-11-20 LAB — LIPID PANEL
Cholesterol: 141 mg/dL (ref <200)
HDL: 52 mg/dL (ref >=50)
LDL Cholesterol (Calc): 76 mg/dL (calc)
Non-HDL Cholesterol (Calc): 89 mg/dL (calc) (ref <130)
Total CHOL/HDL Ratio: 2.7 (calc) (ref <5.0)
Triglycerides: 57 mg/dL (ref <150)

## 2019-11-20 LAB — HEPATIC FUNCTION PANEL
AG Ratio: 1.7 (calc) (ref 1.0–2.5)
ALT: 7 U/L (ref 6–29)
AST: 17 U/L (ref 10–30)
Albumin: 5.1 g/dL (ref 3.6–5.1)
Alkaline phosphatase (APISO): 60 U/L (ref 31–125)
Bilirubin, Direct: 0.1 mg/dL (ref 0.0–0.2)
Globulin: 3 g/dL (calc) (ref 1.9–3.7)
Indirect Bilirubin: 0.4 mg/dL (calc) (ref 0.2–1.2)
Total Bilirubin: 0.5 mg/dL (ref 0.2–1.2)
Total Protein: 8.1 g/dL (ref 6.1–8.1)

## 2019-11-20 LAB — TSH: TSH: 1.08 mIU/L

## 2019-11-20 LAB — HEPATITIS C ANTIBODY
Hepatitis C Ab: NONREACTIVE
SIGNAL TO CUT-OFF: 0.01 (ref <1.00)

## 2020-05-05 ENCOUNTER — Encounter: Payer: Self-pay | Admitting: Family Medicine

## 2020-05-05 ENCOUNTER — Other Ambulatory Visit: Payer: Self-pay

## 2020-05-05 ENCOUNTER — Ambulatory Visit: Payer: 59 | Admitting: Family Medicine

## 2020-05-05 VITALS — BP 110/60 | HR 112 | Temp 98.3°F | Wt 104.9 lb

## 2020-05-05 DIAGNOSIS — F4010 Social phobia, unspecified: Secondary | ICD-10-CM

## 2020-05-05 MED ORDER — ESCITALOPRAM OXALATE 10 MG PO TABS: ORAL_TABLET | ORAL | 5 refills | Status: DC

## 2020-05-05 NOTE — Patient Instructions (Signed)
Social Anxiety Disorder, Adult Social anxiety disorder (SAD), previously called social phobia, is a mental health condition. People with SAD often feel nervous, afraid, or embarrassed when they are around other people in social situations. They worry that other people are judging or criticizing them for how they look, what they say, or how they act. SAD involves more than just feeling shy or self-conscious at times. It can cause severe emotional distress. It can interfere with activities of daily life. SAD also may lead to alcohol or drug use, and even suicide. SAD is a common mental health condition. It can develop at any time, but it usually starts in the teenage years. What are the causes? The cause of this condition is not known. It may involve genes that are passed through families. Stressful events may trigger anxiety. This disorder is also associated with an overactive amygdala. The amygdala is the part of the brain that triggers your response to strong feelings, such as fear. What increases the risk? This condition is more likely to develop in:  People who have a family history of anxiety disorders.  Women.  People who have a physical or behavioral condition that makes them feel self-conscious or nervous, such as a stutter or a long-term (chronic) disease. What are the signs or symptoms? The main symptom of this condition is fear of embarrassment caused by being criticized or judged in social situations. You may be afraid to:  Speak in public.  Go shopping.  Use a public bathroom.  Eat at a restaurant.  Go to work.  Interact with people you do not know. Extreme fear and anxiety may cause physical symptoms, including:  Blushing.  A fast heartbeat.  Sweating.  Shaky hands or voice.  Confusion.  Light-headedness.  Upset stomach, diarrhea, or vomiting.  Shortness of breath. How is this diagnosed? This condition is diagnosed based on your history, symptoms, and  behavior in social situations. You may be diagnosed with this type of anxiety if your symptoms have lasted for more than 6 months and have been present on more days than not. Your health care provider may ask you about your use of alcohol, drugs, and prescription medicines. He or she may also refer you to a mental health specialist for further evaluation or treatment. How is this treated? Treatment for this condition may include:  Cognitive behavioral therapy (CBT). This type of talk therapy helps you learn to replace negative thoughts and behaviors with positive ones. This may include learning how to use self-calming skills and other methods of managing your anxiety.  Exposure therapy. You will be exposed to social situations that cause you fear. The treatment starts with practicing self-calming in situations that cause you low levels of fear. Over time, you will progress by sustaining self-calming and managing harder situations.  Antidepressant medicines. These medicines may be used by themselves or in addition to other therapies.  Biofeedback. This process trains you to manage your body's response (physiological response) through breathing techniques and relaxation methods. You will work with a therapist while machines are used to monitor your physical symptoms.  Techniques for relaxation and managing anxiety. These include deep breathing, self-talk, meditation, visual imagery, muscle relaxation, music therapy, and yoga. These techniques are often used with other therapies to keep you calm in situations that cause you anxiety. These treatments are often used in combination.   Follow these instructions at home: Alcohol use If you drink alcohol:  Limit how much you use to: ? 0-1 drink a  day for nonpregnant women. ? 0-2 drinks a day for men.  Be aware of how much alcohol is in your drink. In the [place] one drink equals one 12 oz bottle of beer (355 mL), one 5 oz glass of wine (148 mL), or one  1 oz glass of hard liquor (44 mL). General instructions  Take over-the-counter and prescription medicines only as told by your health care provider.  Practice techniques for relaxation and managing anxiety at times you are not challenged by social anxiety.  Return to social activities using techniques you have learned, as you feel ready to do so.  Avoid caffeine and certain over-the-counter cold medicines. These may make you feel worse. Ask your pharmacists which medicines to avoid.  Keep all follow-up visits as told by your health care provider. This is important. Where to find more information  National Alliance on Mental Illness (NAMI): https://www.nami.org  Social Anxiety Association: https://socialphobia.org  Mental Health America (MHA): https://www.mhanational.org  Anxiety and Depression Association of America (ADAA): https://adaa.org/ Contact a health care provider if:  Your symptoms do not improve or get worse.  You have signs of depression, such as: ? Persistent sadness or moodiness. ? Loss of enjoyment in activities that used to bring you joy. ? Change in weight or eating. ? Changes in sleeping habits. ? Avoiding friends or family members more than usual. ? Loss of energy for normal tasks. ? Feeling guilty or worthless.  You become more isolated than you normally are.  You find it more and more difficult to speak or interact with others.  You are using drugs.  You are drinking more alcohol than usual. Get help right away if:  You harm yourself.  You have suicidal thoughts. If you ever feel like you may hurt yourself or others, or have thoughts about taking your own life, get help right away. You can go to your nearest emergency department or call:  Your local emergency services (911 in the [place]  A suicide crisis helpline, such as the National Suicide Prevention Lifeline at 1-800-273-8255. This is open 24 hours a day. Summary  Social anxiety disorder  (SAD) may cause you to feel nervous, afraid, or embarrassed when you are around other people in social situations.  SAD is a common mental disorder. It can develop at any time, but it usually starts in the teenage years.  Treatment includes talk therapy, exposure therapy, medicines, biofeedback, and relaxation techniques. It can involve a combination of treatments. This information is not intended to replace advice given to you by your health care provider. Make sure you discuss any questions you have with your health care provider. Document Revised: 05/30/2018 Document Reviewed: 05/30/2018 Elsevier Patient Education  2021 [employer]vier Inc.  

## 2020-05-05 NOTE — Progress Notes (Signed)
Established Patient Office Visit  Subjective:  Patient ID: Candice Rowe, adult    DOB: September 15, 1995  Age: 25 y.o. MRN: [id number]  CC:  Chief Complaint  Patient presents with  . Anxiety    Pt states she has had anxiety for a while but it is getting worse    HPI [name] presents for daily and progressive anxiety symptoms.  She states she has been anxious for years.  This seems to occur frequent social settings.  She has difficulty talking with individuals and that does create stress and sometimes avoidance behavior.  She also relates having fairly pervasive worries about things.  No history of posttraumatic stress issues.  No OCD tendencies.  Occasionally does feel depressed but she feels like her anxiety ways and much heavier than her depression symptoms.  No history of panic disorder.  Does occasionally have anxiety symptoms at home when she is alone but mostly in crowds.  Never treated with medication.  She had lab work last fall including TSH which was normal.  She has managed to gain about 5 pounds since then and would like to gain some more.  Appetite is good.  Past Medical History:  Diagnosis Date  . Migraines   . Premature birth    pt has a twin, born at 4 weeks    No past surgical history on file.  Family History  Problem Relation Age of Onset  . Cancer Mother   . Hypertension Mother   . Stroke Mother   . Hyperlipidemia Father     Social History   Socioeconomic History  . Marital status: Single    Spouse name: Not on file  . Number of children: Not on file  . Years of education: Not on file  . Highest education level: Not on file  Occupational History  . Not on file  Tobacco Use  . Smoking status: Never Smoker  . Smokeless tobacco: Never Used  Vaping Use  . Vaping Use: Never used  Substance and Sexual Activity  . Alcohol use: Yes    Alcohol/week: 1.0 standard drink    Types: 1 Shots of liquor per week    Comment: Occasionally  . Drug use:  Never  . Sexual activity: Never  Other Topics Concern  . Not on file  Social History Narrative  . Not on file   Social Determinants of Health   Financial Resource Strain: Not on file  Food Insecurity: Not on file  Transportation Needs: Not on file  Physical Activity: Not on file  Stress: Not on file  Social Connections: Not on file  Intimate Partner Violence: Not on file    Outpatient Medications Prior to Visit  Medication Sig Dispense Refill  . rizatriptan (MAXALT-MLT) 10 MG disintegrating tablet Take one at onset of migraine and may repeat one in 2 hours as needed .    Max of 2 in 24 hours. 10 tablet 0   No facility-administered medications prior to visit.    Allergies  Allergen Reactions  . Dust Mite Extract     Watery eyes, sneezing, hives if patient does not wash hands. Also has pet dander allergy    ROS Review of Systems  Constitutional: Negative for chills, fever and unexpected weight change.  Cardiovascular: Negative for chest pain.  Gastrointestinal: Negative for abdominal pain.  Psychiatric/Behavioral: Negative for agitation and suicidal ideas. The patient is nervous/anxious.       Objective:    Physical Exam Vitals reviewed.  Cardiovascular:  Comments: Heart rhythm is regular but slightly fast with heart rate around 100.  She thinks this is typical during exams Pulmonary:     Effort: Pulmonary effort is normal.     Breath sounds: Normal breath sounds.  Musculoskeletal:     Right lower leg: No edema.     Left lower leg: No edema.  Neurological:     General: No focal deficit present.     Mental Status: Candice Rowe is alert.     Cranial Nerves: No cranial nerve deficit.  Psychiatric:     Comments: GAD anxiety screen score is 19     BP 110/60 (BP Location: Left Arm, Patient Position: Sitting, Cuff Size: Normal)   Pulse (!) 112   Temp 98.3 F (36.8 C) (Oral)   Wt 104 lb 14.4 oz (47.6 kg)   SpO2 99%   BMI 18.15 kg/m  Wt Readings from  Last 3 Encounters:  05/05/20 104 lb 14.4 oz (47.6 kg)  11/19/19 98 lb 3.2 oz (44.5 kg)  03/07/18 111 lb (50.3 kg)     Health Maintenance Due  Topic Date Due  . HPV VACCINES (1 - 2-dose series) Never done  . HIV Screening  Never done  . TETANUS/TDAP  Never done  . COVID-19 Vaccine (3 - Booster for Moderna series) 08/19/2019       Topic Date Due  . HPV VACCINES (1 - 2-dose series) Never done    Lab Results  Component Value Date   TSH 1.08 11/19/2019   Lab Results  Component Value Date   WBC 5.3 11/19/2019   HGB 14.9 11/19/2019   HCT 46.1 (H) 11/19/2019   MCV 84.9 11/19/2019   PLT 284 11/19/2019   Lab Results  Component Value Date   NA 141 11/19/2019   K 4.4 11/19/2019   CO2 23 11/19/2019   GLUCOSE 81 11/19/2019   BUN 14 11/19/2019   CREATININE 0.89 11/19/2019   BILITOT 0.5 11/19/2019   ALKPHOS 49 08/16/2016   AST 17 11/19/2019   ALT 7 11/19/2019   PROT 8.1 11/19/2019   ALBUMIN 4.2 08/16/2016   CALCIUM 10.2 11/19/2019   ANIONGAP 11 08/16/2016   Lab Results  Component Value Date   CHOL 141 11/19/2019   Lab Results  Component Value Date   HDL 52 11/19/2019   Lab Results  Component Value Date   LDLCALC 76 11/19/2019   Lab Results  Component Value Date   TRIG 57 11/19/2019   Lab Results  Component Value Date   CHOLHDL 2.7 11/19/2019   No results found for: HGBA1C    Assessment & Plan:   Problem List Items Addressed This Visit   None   Visit Diagnoses    Social anxiety disorder    -  Primary   Relevant Medications   escitalopram (LEXAPRO) 10 MG tablet    Patient has fairly pervasive longstanding anxiety symptoms.  Suspect definite component of social anxiety and may have some elements of generalized anxiety as well.  No evidence for panic disorder, OCD, or posttraumatic stress disorder.  -We discussed potential role medication.  Decided to start Lexapro 10 mg 1/2 tablet daily for 1 week and then titrate to 10 mg daily -Set up 1 month  follow-up to assess -Could also consider counseling but she would like to see response of medication first  Meds ordered this encounter  Medications  . escitalopram (LEXAPRO) 10 MG tablet    Sig: Take one half tablet by mouth once daily  for one week and then increase to one tablet daily.    Dispense:  30 tablet    Refill:  5    Follow-up: Return in about 1 month (around 06/04/2020).    Evelena Peat, MD

## 2020-06-05 ENCOUNTER — Ambulatory Visit: Payer: 59 | Admitting: Family Medicine

## 2020-06-09 ENCOUNTER — Other Ambulatory Visit: Payer: Self-pay

## 2020-06-09 ENCOUNTER — Ambulatory Visit: Payer: 59 | Admitting: Family Medicine

## 2020-06-09 ENCOUNTER — Encounter: Payer: Self-pay | Admitting: Family Medicine

## 2020-06-09 DIAGNOSIS — F4010 Social phobia, unspecified: Secondary | ICD-10-CM | POA: Diagnosis not present

## 2020-06-09 NOTE — Progress Notes (Signed)
Established Patient Office Visit  Subjective:  Patient ID: Candice Rowe, adult    DOB: Apr 12, 1995  Age: 25 y.o. MRN: [id number]  CC:  Chief Complaint  Patient presents with  . Follow-up    Anxiety, tolerating medication changes well    HPI Candice Rowe presents for anxiety symptoms.  Refer to prior note for details.  She is having some crippling anxiety issues especially with things like driving.  We started Lexapro 10 mg daily and she is tolerating well.  She has seen a tremendous improvement.  She no longer has a tread of things like driving.  She also frequently had difficulties going into public places and has found it much easier to going to places like grocery stores or shopping centers to engage with others since taking the Lexapro.  She does state that she frequently sleeps up to 12 hours/day and sometimes has low energy and low initiative.  She thinks her may be some mild depression symptoms but is not sure she wishes to commit to additional depression medication at this time.  She had normal TSH last fall.  She is planning to start exercising soon  Past Medical History:  Diagnosis Date  . Migraines   . Premature birth    pt has a twin, born at 43 weeks    No past surgical history on file.  Family History  Problem Relation Age of Onset  . Cancer Mother   . Hypertension Mother   . Stroke Mother   . Hyperlipidemia Father     Social History   Socioeconomic History  . Marital status: Single    Spouse name: Not on file  . Number of children: Not on file  . Years of education: Not on file  . Highest education level: Not on file  Occupational History  . Not on file  Tobacco Use  . Smoking status: Never Smoker  . Smokeless tobacco: Never Used  Vaping Use  . Vaping Use: Never used  Substance and Sexual Activity  . Alcohol use: Yes    Alcohol/week: 1.0 standard drink    Types: 1 Shots of liquor per week    Comment: Occasionally  . Drug use: Never  .  Sexual activity: Never  Other Topics Concern  . Not on file  Social History Narrative  . Not on file   Social Determinants of Health   Financial Resource Strain: Not on file  Food Insecurity: Not on file  Transportation Needs: Not on file  Physical Activity: Not on file  Stress: Not on file  Social Connections: Not on file  Intimate Partner Violence: Not on file    Outpatient Medications Prior to Visit  Medication Sig Dispense Refill  . escitalopram (LEXAPRO) 10 MG tablet Take one half tablet by mouth once daily for one week and then increase to one tablet daily. 30 tablet 5  . rizatriptan (MAXALT-MLT) 10 MG disintegrating tablet Take one at onset of migraine and may repeat one in 2 hours as needed .    Max of 2 in 24 hours. 10 tablet 0   No facility-administered medications prior to visit.    Allergies  Allergen Reactions  . Dust Mite Extract     Watery eyes, sneezing, hives if patient does not wash hands. Also has pet dander allergy    ROS Review of Systems  Constitutional: Negative for appetite change and unexpected weight change.  Psychiatric/Behavioral: Negative for agitation, hallucinations and suicidal ideas.  Objective:    Physical Exam Vitals reviewed.  Constitutional:      Appearance: Normal appearance.  Cardiovascular:     Rate and Rhythm: Normal rate and regular rhythm.  Pulmonary:     Effort: Pulmonary effort is normal.     Breath sounds: Normal breath sounds.  Neurological:     General: No focal deficit present.     Mental Status: Candice Rowe is alert.  Psychiatric:        Mood and Affect: Mood normal.        Thought Content: Thought content normal.     BP 120/64 (BP Location: Left Arm, Patient Position: Sitting, Cuff Size: Normal)   Pulse (!) 116   Temp 98 F (36.7 C) (Oral)   Wt 104 lb 8 oz (47.4 kg)   SpO2 98%   BMI 18.08 kg/m  Wt Readings from Last 3 Encounters:  06/09/20 104 lb 8 oz (47.4 kg)  05/05/20 104 lb 14.4 oz (47.6  kg)  11/19/19 98 lb 3.2 oz (44.5 kg)     Health Maintenance Due  Topic Date Due  . HPV VACCINES (1 - 2-dose series) Never done  . HIV Screening  Never done  . TETANUS/TDAP  Never done  . COVID-19 Vaccine (3 - Booster for Moderna series) 07/19/2019       Topic Date Due  . HPV VACCINES (1 - 2-dose series) Never done    Lab Results  Component Value Date   TSH 1.08 11/19/2019   Lab Results  Component Value Date   WBC 5.3 11/19/2019   HGB 14.9 11/19/2019   HCT 46.1 (H) 11/19/2019   MCV 84.9 11/19/2019   PLT 284 11/19/2019   Lab Results  Component Value Date   NA 141 11/19/2019   K 4.4 11/19/2019   CO2 23 11/19/2019   GLUCOSE 81 11/19/2019   BUN 14 11/19/2019   CREATININE 0.89 11/19/2019   BILITOT 0.5 11/19/2019   ALKPHOS 49 08/16/2016   AST 17 11/19/2019   ALT 7 11/19/2019   PROT 8.1 11/19/2019   ALBUMIN 4.2 08/16/2016   CALCIUM 10.2 11/19/2019   ANIONGAP 11 08/16/2016   Lab Results  Component Value Date   CHOL 141 11/19/2019   Lab Results  Component Value Date   HDL 52 11/19/2019   Lab Results  Component Value Date   LDLCALC 76 11/19/2019   Lab Results  Component Value Date   TRIG 57 11/19/2019   Lab Results  Component Value Date   CHOLHDL 2.7 11/19/2019   No results found for: HGBA1C    Assessment & Plan:   Problem List Items Addressed This Visit      Unprioritized   Social anxiety disorder    Patient has social anxiety symptoms greatly improved on Lexapro 10 mg daily.  Continue current dose.  She is having some mild depression symptoms and is currently in counseling.  We discussed other nonpharmacologic management including regular exercise and possibly being more regimented with time getting up each day.  We discussed possible additional medication such as low-dose Wellbutrin but at this point she wishes to wait  No orders of the defined types were placed in this encounter.   Follow-up: No follow-ups on file.    Evelena Peat, MD

## 2020-06-09 NOTE — Patient Instructions (Signed)
Social Anxiety Disorder, Adult Social anxiety disorder (SAD), previously called social phobia, is a mental health condition. People with SAD often feel nervous, afraid, or embarrassed when they are around other people in social situations. They worry that other people are judging or criticizing them for how they look, what they say, or how they act. SAD involves more than just feeling shy or self-conscious at times. It can cause severe emotional distress. It can interfere with activities of daily life. SAD also may lead to alcohol or drug use, and even suicide. SAD is a common mental health condition. It can develop at any time, but it usually starts in the teenage years. What are the causes? The cause of this condition is not known. It may involve genes that are passed through families. Stressful events may trigger anxiety. This disorder is also associated with an overactive amygdala. The amygdala is the part of the brain that triggers your response to strong feelings, such as fear. What increases the risk? This condition is more likely to develop in:  People who have a family history of anxiety disorders.  Women.  People who have a physical or behavioral condition that makes them feel self-conscious or nervous, such as a stutter or a long-term (chronic) disease. What are the signs or symptoms? The main symptom of this condition is fear of embarrassment caused by being criticized or judged in social situations. You may be afraid to:  Speak in public.  Go shopping.  Use a public bathroom.  Eat at a restaurant.  Go to work.  Interact with people you do not know. Extreme fear and anxiety may cause physical symptoms, including:  Blushing.  A fast heartbeat.  Sweating.  Shaky hands or voice.  Confusion.  Light-headedness.  Upset stomach, diarrhea, or vomiting.  Shortness of breath. How is this diagnosed? This condition is diagnosed based on your history, symptoms, and  behavior in social situations. You may be diagnosed with this type of anxiety if your symptoms have lasted for more than 6 months and have been present on more days than not. Your health care provider may ask you about your use of alcohol, drugs, and prescription medicines. He or she may also refer you to a mental health specialist for further evaluation or treatment. How is this treated? Treatment for this condition may include:  Cognitive behavioral therapy (CBT). This type of talk therapy helps you learn to replace negative thoughts and behaviors with positive ones. This may include learning how to use self-calming skills and other methods of managing your anxiety.  Exposure therapy. You will be exposed to social situations that cause you fear. The treatment starts with practicing self-calming in situations that cause you low levels of fear. Over time, you will progress by sustaining self-calming and managing harder situations.  Antidepressant medicines. These medicines may be used by themselves or in addition to other therapies.  Biofeedback. This process trains you to manage your body's response (physiological response) through breathing techniques and relaxation methods. You will work with a therapist while machines are used to monitor your physical symptoms.  Techniques for relaxation and managing anxiety. These include deep breathing, self-talk, meditation, visual imagery, muscle relaxation, music therapy, and yoga. These techniques are often used with other therapies to keep you calm in situations that cause you anxiety. These treatments are often used in combination.   Follow these instructions at home: Alcohol use If you drink alcohol:  Limit how much you use to: ? 0-1 drink a  day for nonpregnant women. ? 0-2 drinks a day for men.  Be aware of how much alcohol is in your drink. In the [place] one drink equals one 12 oz bottle of beer (355 mL), one 5 oz glass of wine (148 mL), or one  1 oz glass of hard liquor (44 mL). General instructions  Take over-the-counter and prescription medicines only as told by your health care provider.  Practice techniques for relaxation and managing anxiety at times you are not challenged by social anxiety.  Return to social activities using techniques you have learned, as you feel ready to do so.  Avoid caffeine and certain over-the-counter cold medicines. These may make you feel worse. Ask your pharmacists which medicines to avoid.  Keep all follow-up visits as told by your health care provider. This is important. Where to find more information  National Alliance on Mental Illness (NAMI): https://www.nami.org  Social Anxiety Association: https://socialphobia.org  Mental Health America (MHA): https://www.mhanational.org  Anxiety and Depression Association of America (ADAA): https://adaa.org/ Contact a health care provider if:  Your symptoms do not improve or get worse.  You have signs of depression, such as: ? Persistent sadness or moodiness. ? Loss of enjoyment in activities that used to bring you joy. ? Change in weight or eating. ? Changes in sleeping habits. ? Avoiding friends or family members more than usual. ? Loss of energy for normal tasks. ? Feeling guilty or worthless.  You become more isolated than you normally are.  You find it more and more difficult to speak or interact with others.  You are using drugs.  You are drinking more alcohol than usual. Get help right away if:  You harm yourself.  You have suicidal thoughts. If you ever feel like you may hurt yourself or others, or have thoughts about taking your own life, get help right away. You can go to your nearest emergency department or call:  Your local emergency services (911 in the [place]  A suicide crisis helpline, such as the National Suicide Prevention Lifeline at 1-800-273-8255. This is open 24 hours a day. Summary  Social anxiety disorder  (SAD) may cause you to feel nervous, afraid, or embarrassed when you are around other people in social situations.  SAD is a common mental disorder. It can develop at any time, but it usually starts in the teenage years.  Treatment includes talk therapy, exposure therapy, medicines, biofeedback, and relaxation techniques. It can involve a combination of treatments. This information is not intended to replace advice given to you by your health care provider. Make sure you discuss any questions you have with your health care provider. Document Revised: 05/30/2018 Document Reviewed: 05/30/2018 Elsevier Patient Education  2021 [employer]vier Inc.  

## 2020-06-10 ENCOUNTER — Ambulatory Visit: Payer: 59 | Admitting: Podiatry

## 2020-06-10 ENCOUNTER — Encounter: Payer: Self-pay | Admitting: Podiatry

## 2020-06-10 DIAGNOSIS — M79675 Pain in left toe(s): Secondary | ICD-10-CM | POA: Diagnosis not present

## 2020-06-10 DIAGNOSIS — M79674 Pain in right toe(s): Secondary | ICD-10-CM

## 2020-06-10 DIAGNOSIS — L603 Nail dystrophy: Secondary | ICD-10-CM | POA: Diagnosis not present

## 2020-06-10 NOTE — Patient Instructions (Signed)

## 2020-06-21 NOTE — Progress Notes (Signed)
     HPI: 25 year old female presents via virtual office visit for evaluation regarding dystrophic toenails to the bilateral great toes.  Patient has had thickened disfigured toenails for several years now.  Last visit in the office on 03/14/2018 nail biopsy was performed and sent to pathology.  Patient states that for the last few years the nails have continued to be thickened discolored and dystrophic.  She is very frustrated and would like to discuss different treatment options to help alleviate her symptoms.  Past Medical History:  Diagnosis Date   Migraines    Premature birth    pt has a twin, born at 16 weeks   Nail biopsy pathology report 03/14/2018: - Pathologic nailbed keratinization - Subungual bacterial colonization - Onychoschizia (pathologic splitting of nail plate) - PAS reaction and GMS stains failed to demonstrate fungal elements - A Fontana-Masson stain fails to demonstrate melanin pigment  Assessment: 1.  Onychodystrophy bilateral great toenails 2.  Negative for onychomycosis   Plan of Care:  1. Patient evaluated. 2.  After discussing the patient's treatment options today and she has had these thick discolored nails for several years, we decided to perform total temporary nail avulsions to the bilateral great toes.  The patient agrees. 3.  The toes were prepped in aseptic manner and digital block performed using 3 mL of 2% lidocaine plain 4.  Total temporary nail avulsions were performed to the bilateral great toenails followed by light dressings.  Post care instructions provided.  Recommend Neosporin or topical antibiotic daily 5.  Resume urea topical in 4 weeks to soften the nails as it regrows 6.  Return to clinic as needed     Felecia Shelling, DPM [facility]  Dr. Felecia Shelling, DPM    [address] [place], [place] [address]                Office [phone]  Fax [phone]

## 2020-08-23 ENCOUNTER — Other Ambulatory Visit: Payer: Self-pay | Admitting: Family Medicine

## 2021-02-13 ENCOUNTER — Other Ambulatory Visit: Payer: Self-pay | Admitting: Family Medicine

## 2021-03-15 ENCOUNTER — Emergency Department (HOSPITAL_COMMUNITY): Payer: Commercial Managed Care - HMO

## 2021-03-15 ENCOUNTER — Encounter (HOSPITAL_COMMUNITY): Payer: Self-pay

## 2021-03-15 ENCOUNTER — Ambulatory Visit (HOSPITAL_COMMUNITY)
Admission: EM | Admit: 2021-03-15 | Discharge: 2021-03-15 | Disposition: A | Discharge status: Other Acute Inpatient Hospital | Payer: Managed Care, Other (non HMO) | Attending: Physician Assistant | Admitting: Physician Assistant

## 2021-03-15 ENCOUNTER — Emergency Department (HOSPITAL_COMMUNITY)
Admission: EM | Admit: 2021-03-15 | Discharge: 2021-03-16 | Disposition: A | Discharge status: Home / Self Care | Payer: Commercial Managed Care - HMO | Attending: Emergency Medicine | Admitting: Emergency Medicine

## 2021-03-15 ENCOUNTER — Other Ambulatory Visit: Payer: Self-pay

## 2021-03-15 DIAGNOSIS — R102 Pelvic and perineal pain: Secondary | ICD-10-CM

## 2021-03-15 DIAGNOSIS — Z5321 Procedure and treatment not carried out due to patient leaving prior to being seen by health care provider: Secondary | ICD-10-CM | POA: Insufficient documentation

## 2021-03-15 DIAGNOSIS — R1031 Right lower quadrant pain: Secondary | ICD-10-CM | POA: Diagnosis present

## 2021-03-15 DIAGNOSIS — N200 Calculus of kidney: Secondary | ICD-10-CM | POA: Diagnosis not present

## 2021-03-15 LAB — CBC
HCT: 37.7 % (ref 36.0–46.0)
Hemoglobin: 12.4 g/dL (ref 12.0–15.0)
MCH: 27 pg (ref 26.0–34.0)
MCHC: 32.9 g/dL (ref 30.0–36.0)
MCV: 82.1 fL (ref 80.0–100.0)
Platelets: 256 10*3/uL (ref 150–400)
RBC: 4.59 MIL/uL (ref 3.87–5.11)
RDW: 12.6 % (ref 11.5–15.5)
WBC: 10.4 10*3/uL (ref 4.0–10.5)
nRBC: 0 % (ref 0.0–0.2)

## 2021-03-15 LAB — COMPREHENSIVE METABOLIC PANEL
ALT: 10 U/L (ref 0–44)
AST: 20 U/L (ref 15–41)
Albumin: 4.2 g/dL (ref 3.5–5.0)
Alkaline Phosphatase: 55 U/L (ref 38–126)
Anion gap: 11 (ref 5–15)
BUN: 10 mg/dL (ref 6–20)
CO2: 21 mmol/L — ABNORMAL LOW (ref 22–32)
Calcium: 9.2 mg/dL (ref 8.9–10.3)
Chloride: 104 mmol/L (ref 98–111)
Creatinine, Ser: 0.87 mg/dL (ref 0.44–1.00)
GFR, Estimated: >60 mL/min (ref >60)
Glucose, Bld: 149 mg/dL — ABNORMAL HIGH (ref 70–99)
Potassium: 3.7 mmol/L (ref 3.5–5.1)
Sodium: 136 mmol/L (ref 135–145)
Total Bilirubin: 0.5 mg/dL (ref 0.3–1.2)
Total Protein: 6.9 g/dL (ref 6.5–8.1)

## 2021-03-15 LAB — URINALYSIS, ROUTINE W REFLEX MICROSCOPIC
Bilirubin Urine: NEGATIVE
Glucose, UA: NEGATIVE mg/dL
Ketones, ur: 20 mg/dL — AB
Nitrite: NEGATIVE
Protein, ur: NEGATIVE mg/dL
Specific Gravity, Urine: 1.035 — ABNORMAL HIGH (ref 1.005–1.030)
pH: 5 (ref 5.0–8.0)

## 2021-03-15 LAB — I-STAT BETA HCG BLOOD, ED (MC, WL, AP ONLY): I-stat hCG, quantitative: <5 m[IU]/mL (ref <5)

## 2021-03-15 LAB — LIPASE, BLOOD: Lipase: 33 U/L (ref 11–51)

## 2021-03-15 MED ORDER — ONDANSETRON 4 MG PO TBDP
4.0000 mg | ORAL_TABLET | Freq: Once | ORAL | Status: AC
  Administered 2021-03-15: 4 mg via ORAL

## 2021-03-15 MED ORDER — MORPHINE SULFATE (PF) 2 MG/ML IV SOLN
2.0000 mg | Freq: Once | INTRAVENOUS | Status: AC
  Administered 2021-03-15: 2 mg via INTRAVENOUS
  Filled 2021-03-15: qty 1

## 2021-03-15 MED ORDER — IOHEXOL 300 MG/ML  SOLN
100.0000 mL | Freq: Once | INTRAMUSCULAR | Status: AC | PRN
  Administered 2021-03-15: 100 mL via INTRAVENOUS

## 2021-03-15 MED ORDER — ONDANSETRON 4 MG PO TBDP
ORAL_TABLET | ORAL | Status: AC
  Filled 2021-03-15: qty 1

## 2021-03-15 NOTE — ED Notes (Signed)
Patient transported to scans ? ?

## 2021-03-15 NOTE — ED Provider Notes (Signed)
I was called into triage by nursing staff to evaluate patient given severity of abdominal pain.  Reports symptoms began approximately 1 hour ago and are localized to right flank with radiation into right abdomen.  She does have a history of kidney stones but is unsure if current symptoms are similar to previous episodes of this condition.  She reports associated nausea but denies any vomiting.  She denies previous abdominal surgeries still has gallbladder and appendix.  Reports pain is rated 9 on a 0-10 pain scale, described as sharp, worse with certain movements, no relieving factors identified.  Discussed that we do not have imaging capabilities in urgent care and given severity of pain she would likely benefit from going to the emergency room where they have the ability to obtain a CT scan if appropriate.  Patient is agreeable to this and will go directly to emergency room.  Her vital signs are stable at the time of discharge and companion will take her to ER. ?  ?Terrilee Croak, PA-C ?03/15/21 1809 ? ?

## 2021-03-15 NOTE — ED Triage Notes (Signed)
Pt arrives POV for eval of sharp shooting pains to RLQ w/ radiation around to back and umbilicus. Reports acute onset about 2 hours PTA. ?

## 2021-03-15 NOTE — ED Provider Triage Note (Signed)
Emergency Medicine Provider Triage Evaluation Note ? ?Candice Rowe , a 26 y.o. adult  was evaluated in triage.  Pt complains of right lower quadrant pain.  Pain started about 2 hours ago.  Started in her right flank and now is radiating up to her right lower quadrant of her abdomen.  She is seen by urgent care and was sent here due to severe pain.  She rates her pain 10 out of 10.  She has had kidney stones in the past but is unsure if this feels similar.  She denies any dysuria or hematuria.  She denies any nausea or vomiting.. ? ?Review of Systems  ?Positive:  ?Negative:  ? ?Physical Exam  ?BP 126/88   Pulse (!) 58   Temp 99.2 ?F (37.3 ?C)   Resp 18   Ht 5' 3.75" (1.619 m)   Wt 48 kg   LMP 03/05/2021   SpO2 100%   BMI 18.31 kg/m?  ?Gen:   Awake, no distress   ?Resp:  Normal effort  ?MSK:   Moves extremities without difficulty  ?Other:   ? ?Medical Decision Making  ?Medically screening exam initiated at 7:08 PM.  Appropriate orders placed.  Candice Rowe was informed that the remainder of the evaluation will be completed by another provider, this initial triage assessment does not replace that evaluation, and the importance of remaining in the ED until their evaluation is complete. ? ? ?  ?Claudie Leach, PA-C ?03/15/21 1909 ? ?

## 2021-03-15 NOTE — ED Triage Notes (Signed)
Pt c/o sharp shooting pains to rt side radiating to rt center abdomen 1hr ago with nausea and dizziness. States pain got worse after having a BM. ?

## 2021-03-15 NOTE — ED Notes (Signed)
Patient is being discharged from the Urgent Care and sent to the Emergency Department via POV . Per Erin,PA, patient is in need of higher level of care due to further evaluation. Patient is aware and verbalizes understanding of plan of care.  ?Vitals:  ? 03/15/21 1758 03/15/21 1806  ?BP: 97/68   ?Pulse: 73   ?Resp: 18   ?SpO2: (!) 10% 100%  ?  ?

## 2021-03-16 ENCOUNTER — Telehealth: Payer: Self-pay | Admitting: Family Medicine

## 2021-03-16 NOTE — Telephone Encounter (Signed)
Pt is calling and does have cpe sch for 03-17-2021 and went to er yesterday and would like someone to go over the blood work  etc she had at er yesterday  ?

## 2021-03-16 NOTE — ED Notes (Signed)
Patient decided to leave with mother , RN / NT explained cause of delay / high census and long wait time at ER with patient /family .  ?

## 2021-03-16 NOTE — Telephone Encounter (Signed)
Left a message for the pt to return my call.  

## 2021-03-16 NOTE — Telephone Encounter (Signed)
Pt informed of the results and verbalized understanding.  ?

## 2021-03-17 ENCOUNTER — Encounter: Payer: Managed Care, Other (non HMO) | Admitting: Family Medicine

## 2021-03-19 ENCOUNTER — Encounter: Payer: Self-pay | Admitting: Family Medicine

## 2021-03-19 ENCOUNTER — Ambulatory Visit (INDEPENDENT_AMBULATORY_CARE_PROVIDER_SITE_OTHER): Payer: Managed Care, Other (non HMO) | Admitting: Family Medicine

## 2021-03-19 VITALS — BP 96/60 | HR 104 | Temp 97.9°F | Ht 64.0 in | Wt 111.0 lb

## 2021-03-19 DIAGNOSIS — Z23 Encounter for immunization: Secondary | ICD-10-CM | POA: Diagnosis not present

## 2021-03-19 DIAGNOSIS — Z0000 Encounter for general adult medical examination without abnormal findings: Secondary | ICD-10-CM | POA: Diagnosis not present

## 2021-03-19 DIAGNOSIS — F339 Major depressive disorder, recurrent, unspecified: Secondary | ICD-10-CM

## 2021-03-19 LAB — LIPID PANEL
Cholesterol: 136 mg/dL (ref 0–200)
HDL: 58.7 mg/dL (ref >39.00)
LDL Cholesterol: 62 mg/dL (ref 0–99)
NonHDL: 77.07
Total CHOL/HDL Ratio: 2
Triglycerides: 73 mg/dL (ref 0.0–149.0)
VLDL: 14.6 mg/dL (ref 0.0–40.0)

## 2021-03-19 LAB — URINALYSIS
Hgb urine dipstick: NEGATIVE
Leukocytes,Ua: NEGATIVE
Nitrite: NEGATIVE
Specific Gravity, Urine: >=1.03 — AB (ref 1.000–1.030)
Total Protein, Urine: NEGATIVE
Urine Glucose: NEGATIVE
Urobilinogen, UA: 0.2 (ref 0.0–1.0)
pH: 5.5 (ref 5.0–8.0)

## 2021-03-19 LAB — TSH: TSH: 3.47 u[IU]/mL (ref 0.35–5.50)

## 2021-03-19 MED ORDER — BUPROPION HCL ER (XL) 150 MG PO TB24: 150.0000 mg | ORAL_TABLET | Freq: Every day | ORAL | 5 refills | Status: DC

## 2021-03-19 MED ORDER — RIZATRIPTAN BENZOATE 10 MG PO TBDP
ORAL_TABLET | ORAL | 3 refills | Status: DC
Start: 2021-03-19 — End: 2021-08-10

## 2021-03-19 NOTE — Progress Notes (Signed)
Established Patient Office Visit  Subjective:  Patient ID: Candice Rowe, adult    DOB: [birth year]  Age: 26 y.o. MRN: [id number]  CC:  Chief Complaint  Patient presents with   Annual Exam    HPI Candice Rowe presents for physical exam and also to review recent work-up through ER.  She developed sudden acute pain right side region.  She went to the ER and ended up having 10-hour wait and left AMA but she had multiple labs done.  Her urinalysis showed large blood.  She is not on her menses.  She had CBC, lipase, CMP which were basically normal.  Pelvic ultrasound unremarkable.  CT abdomen pelvis no acute findings.  She stated her pain had subsided by the time the CT was done.  She did receive some morphine which helped her pain.  She states she felt lay sensation like a "burst "in her right side at 1 point and that was followed by pain relief.  She does have history of kidney stones.  History of migraine headaches.  Requesting refills of Maxalt.  History of depression.  She is currently seeing a Veterinary surgeon.  She is on Lexapro 10 mg daily.  Does have some low ventilation and low energy.  No suicidal ideation.  She would like to explore other antidepressant medication options.  Health maintenance reviewed  -No flu vaccine and tetanus over 10 years.  She would like to get both today. -Pap smear up-to-date.  She is not sexually active.  Social history-she currently lives with some roommates in [place].  She does some digital art work currently.  She is looking at other job options.  Non-smoker.  No regular alcohol.  Family history reviewed with no significant changes  Past Medical History:  Diagnosis Date   Migraines    Premature birth    pt has a twin, born at 18 weeks    History reviewed. No pertinent surgical history.  Family History  Problem Relation Age of Onset   Cancer Mother    Hypertension Mother    Stroke Mother    Hyperlipidemia Father     Social History    Socioeconomic History   Marital status: Single    Spouse name: Not on file   Number of children: Not on file   Years of education: Not on file   Highest education level: Not on file  Occupational History   Not on file  Tobacco Use   Smoking status: Never   Smokeless tobacco: Never  Vaping Use   Vaping Use: Never used  Substance and Sexual Activity   Alcohol use: Yes    Alcohol/week: 1.0 standard drink    Types: 1 Shots of liquor per week    Comment: Occasionally   Drug use: Never   Sexual activity: Never    Birth control/protection: None  Other Topics Concern   Not on file  Social History Narrative   Not on file   Social Determinants of Health   Financial Resource Strain: Not on file  Food Insecurity: Not on file  Transportation Needs: Not on file  Physical Activity: Not on file  Stress: Not on file  Social Connections: Not on file  Intimate Partner Violence: Not on file    Outpatient Medications Prior to Visit  Medication Sig Dispense Refill   escitalopram (LEXAPRO) 10 MG tablet TAKE ONE HALF TABLET BY MOUTH ONCE DAILY FOR ONE WEEK AND THEN INCREASE TO ONE TABLET DAILY. 90 tablet 1   rizatriptan (  MAXALT-MLT) 10 MG disintegrating tablet Take one at onset of migraine and may repeat one in 2 hours as needed .    Max of 2 in 24 hours. 10 tablet 0   No facility-administered medications prior to visit.    Allergies  Allergen Reactions   Dust Mite Extract     Watery eyes, sneezing, hives if patient does not wash hands. Also has pet dander allergy    ROS Review of Systems  Constitutional:  Positive for fatigue. Negative for activity change, appetite change, fever and unexpected weight change.  HENT:  Negative for ear pain, hearing loss, sore throat and trouble swallowing.   Eyes:  Negative for visual disturbance.  Respiratory:  Negative for cough and shortness of breath.   Cardiovascular:  Negative for chest pain and palpitations.  Gastrointestinal:  Negative  for abdominal pain, blood in stool, constipation and diarrhea.       See HPI  Genitourinary:  Negative for dysuria and hematuria.  Musculoskeletal:  Negative for arthralgias, back pain and myalgias.  Skin:  Negative for rash.  Neurological:  Negative for dizziness, syncope and headaches.  Hematological:  Negative for adenopathy.  Psychiatric/Behavioral:  Positive for dysphoric mood. Negative for confusion, sleep disturbance and suicidal ideas.      Objective:    Physical Exam Constitutional:      Appearance: Candice Rowe is well-developed.  HENT:     Head: Normocephalic and atraumatic.  Eyes:     Pupils: Pupils are equal, round, and reactive to light.  Neck:     Thyroid: No thyromegaly.  Cardiovascular:     Rate and Rhythm: Normal rate and regular rhythm.     Heart sounds: Normal heart sounds. No murmur heard. Pulmonary:     Effort: No respiratory distress.     Breath sounds: Normal breath sounds. No wheezing or rales.  Abdominal:     General: Bowel sounds are normal. There is no distension.     Palpations: Abdomen is soft. There is no mass.     Tenderness: There is no abdominal tenderness. There is no guarding or rebound.  Musculoskeletal:        General: Normal range of motion.     Cervical back: Normal range of motion and neck supple.     Right lower leg: No edema.     Left lower leg: No edema.  Lymphadenopathy:     Cervical: No cervical adenopathy.  Skin:    Findings: No rash.  Neurological:     Mental Status: Candice Rowe is alert and oriented to person, place, and time.     Cranial Nerves: No cranial nerve deficit.  Psychiatric:        Behavior: Behavior normal.        Thought Content: Thought content normal.        Judgment: Judgment normal.    BP 96/60 (BP Location: Left Arm, Patient Position: Sitting, Cuff Size: Normal)    Pulse (!) 104    Temp 97.9 F (36.6 C) (Oral)    Ht 5\' 4"  (1.626 m)    Wt 111 lb (50.3 kg)    LMP 03/05/2021    SpO2 96%    BMI  19.05 kg/m  Wt Readings from Last 3 Encounters:  03/19/21 111 lb (50.3 kg)  03/15/21 105 lb 13.1 oz (48 kg)  06/09/20 104 lb 8 oz (47.4 kg)     Health Maintenance Due  Topic Date Due   HPV VACCINES (1 - 2-dose series)  Never done   HIV Screening  Never done   TETANUS/TDAP  Never done   COVID-19 Vaccine (3 - Booster for Moderna series) 04/16/2019   INFLUENZA VACCINE  08/10/2020       Topic Date Due   HPV VACCINES (1 - 2-dose series) Never done    Lab Results  Component Value Date   TSH 1.08 11/19/2019   Lab Results  Component Value Date   WBC 10.4 03/15/2021   HGB 12.4 03/15/2021   HCT 37.7 03/15/2021   MCV 82.1 03/15/2021   PLT 256 03/15/2021   Lab Results  Component Value Date   NA 136 03/15/2021   K 3.7 03/15/2021   CO2 21 (L) 03/15/2021   GLUCOSE 149 (H) 03/15/2021   BUN 10 03/15/2021   CREATININE 0.87 03/15/2021   BILITOT 0.5 03/15/2021   ALKPHOS 55 03/15/2021   AST 20 03/15/2021   ALT 10 03/15/2021   PROT 6.9 03/15/2021   ALBUMIN 4.2 03/15/2021   CALCIUM 9.2 03/15/2021   ANIONGAP 11 03/15/2021   Lab Results  Component Value Date   CHOL 141 11/19/2019   Lab Results  Component Value Date   HDL 52 11/19/2019   Lab Results  Component Value Date   LDLCALC 76 11/19/2019   Lab Results  Component Value Date   TRIG 57 11/19/2019   Lab Results  Component Value Date   CHOLHDL 2.7 11/19/2019   No results found for: HGBA1C    Assessment & Plan:   #1 physical exam.  We discussed the following health maintenance issues  -Tdap and influenza vaccines given -We will check lipids and TSH and repeat urinalysis.  Elected not to get other labs since she had these done recently through ER visit  #2 history of migraine headaches.  Stable.  Refill Maxalt for as needed use  #3 recent right abdominal pain.  Suspect kidney stone especially given the blood in her urine.  She has no pain whatsoever at this time.  Recent CT abdomen pelvis no acute  findings.  #4 history of depression.  Still has some depression symptoms with fatigue and low motivation.  We discussed adding Wellbutrin XL 150 mg to her Lexapro.  Continue counseling.  Set up 1 month follow-up to reassess.   Meds ordered this encounter  Medications   rizatriptan (MAXALT-MLT) 10 MG disintegrating tablet    Sig: Take one at onset of migraine and may repeat one in 2 hours as needed .    Max of 2 in 24 hours.    Dispense:  10 tablet    Refill:  3   buPROPion (WELLBUTRIN XL) 150 MG 24 hr tablet    Sig: Take 1 tablet (150 mg total) by mouth daily.    Dispense:  30 tablet    Refill:  5    Follow-up: Return in about 1 month (around 04/19/2021).    Evelena Peat, MD

## 2021-04-10 ENCOUNTER — Other Ambulatory Visit: Payer: Self-pay | Admitting: Family Medicine

## 2021-04-12 ENCOUNTER — Other Ambulatory Visit: Payer: Self-pay

## 2021-04-28 ENCOUNTER — Ambulatory Visit (INDEPENDENT_AMBULATORY_CARE_PROVIDER_SITE_OTHER): Payer: Managed Care, Other (non HMO) | Admitting: Family Medicine

## 2021-04-28 VITALS — BP 94/60 | HR 115 | Temp 97.9°F | Ht 64.0 in | Wt 112.4 lb

## 2021-04-28 DIAGNOSIS — R4589 Other symptoms and signs involving emotional state: Secondary | ICD-10-CM | POA: Diagnosis not present

## 2021-04-28 NOTE — Progress Notes (Signed)
?  Subjective:  ?  ? Patient ID: Candice Rowe, adult   DOB: 03/17/1995, 26 y.o.   MRN: [id number] ? ?HPI ?Candice Rowe is seen in follow-up from recent physical exam.  She has history of depression and is on Lexapro 10 mg daily and has seen a Social worker.  She complained of low motivation and low energy.  We discussed addition of low-dose Wellbutrin to her Lexapro.  She does folic her mood is somewhat improved but has not seen much change in terms of fatigue.  Still has some fatigue but generally sleeping fairly well.  Denies any other side effects.  No suicidal ideation. ? ?Has slightly elevated pulse but minimal caffeine use.  Recent TSH normal.  No evidence for anemia by recent labs. ? ?Past Medical History:  ?Diagnosis Date  ? Migraines   ? Premature birth   ? pt has a twin, born at 36 weeks  ? ?History reviewed. No pertinent surgical history. ? reports that Candice Rowe has never smoked. Candice Rowe has never used smokeless tobacco. Candice Rowe reports current alcohol use of about 1.0 standard drink per week. Candice Rowe reports that Candice Rowe does not use drugs. ?family history includes Cancer in [place] R. Fults mother; Hyperlipidemia in [place] R. [place]'s father; Hypertension in [place]. [place]'s mother; Stroke in [place]. [place] mother. ?Allergies  ?Allergen Reactions  ? Dust Mite Extract   ?  Watery eyes, sneezing, hives if patient does not wash hands. Also has pet dander allergy  ? ? ? ?Review of Systems  ?Constitutional:  Negative for appetite change and unexpected weight change.  ?Respiratory:  Negative for shortness of breath.   ?Cardiovascular:  Negative for chest pain.  ?Psychiatric/Behavioral:  Negative for agitation, behavioral problems, confusion and suicidal ideas.   ? ?   ?Objective:  ? Physical Exam ?Vitals reviewed.  ?Constitutional:   ?   Appearance: Normal appearance.  ?Cardiovascular:  ?   Comments: She has mild tachycardia with heart rate around 110 which apparently is near her baseline.   Regular rhythm. ?Pulmonary:  ?   Effort: Pulmonary effort is normal.  ?   Breath sounds: Normal breath sounds.  ?Neurological:  ?   Mental Status: Candice Rowe is alert.  ?Psychiatric:     ?   Mood and Affect: Mood normal.     ?   Thought Content: Thought content normal.     ?   Judgment: Judgment normal.  ? ? ?   ?Assessment:  ?   ?History of recurrent depression.  Recent addition of Wellbutrin XL 150 mg to her Lexapro.  She has seen some improvement in mood but not much change in terms of her fatigue.  Motivation is fair. ?   ?Plan:  ?   ?-Continue counseling ?-Continue current regimen of Lexapro 10 mg daily and Wellbutrin XL 150 mg daily ?-We did discuss possible titration of Wellbutrin but she declines at this time ?-Encouraged to establish more consistent exercise. ?-Follow-up for any increased depression symptoms or other concerns ? ?Eulas Post MD ?[facility] ? ?   ?

## 2021-04-28 NOTE — Patient Instructions (Signed)
Let's continue the Wellbutrin for now and let me know if you would like to refill in 6 months.   ?

## 2021-08-10 ENCOUNTER — Other Ambulatory Visit: Payer: Self-pay | Admitting: Family Medicine

## 2021-08-28 ENCOUNTER — Other Ambulatory Visit: Payer: Self-pay | Admitting: Family Medicine

## 2021-09-12 ENCOUNTER — Other Ambulatory Visit: Payer: Self-pay | Admitting: Family Medicine

## 2021-09-30 ENCOUNTER — Other Ambulatory Visit: Payer: Self-pay | Admitting: Family Medicine

## 2021-10-08 ENCOUNTER — Other Ambulatory Visit: Payer: Self-pay | Admitting: Family Medicine

## 2021-11-02 ENCOUNTER — Other Ambulatory Visit: Payer: Self-pay | Admitting: Family Medicine

## 2021-11-08 ENCOUNTER — Other Ambulatory Visit: Payer: Self-pay | Admitting: Family Medicine

## 2022-02-21 ENCOUNTER — Other Ambulatory Visit: Payer: Self-pay | Admitting: Family Medicine

## 2022-03-07 ENCOUNTER — Other Ambulatory Visit: Payer: Self-pay | Admitting: Family Medicine

## 2022-03-21 ENCOUNTER — Ambulatory Visit (INDEPENDENT_AMBULATORY_CARE_PROVIDER_SITE_OTHER): Payer: 59 | Admitting: Family Medicine

## 2022-03-21 ENCOUNTER — Encounter: Payer: Self-pay | Admitting: Family Medicine

## 2022-03-21 VITALS — BP 110/80 | HR 90 | Temp 97.7°F | Ht 64.96 in | Wt 124.9 lb

## 2022-03-21 DIAGNOSIS — Z0000 Encounter for general adult medical examination without abnormal findings: Secondary | ICD-10-CM

## 2022-03-21 LAB — BASIC METABOLIC PANEL
BUN: 12 mg/dL (ref 6–23)
CO2: 24 mEq/L (ref 19–32)
Calcium: 9.4 mg/dL (ref 8.4–10.5)
Chloride: 107 mEq/L (ref 96–112)
Creatinine, Ser: 0.82 mg/dL (ref 0.40–1.20)
GFR: 97.8 mL/min (ref >60.00)
Glucose, Bld: 86 mg/dL (ref 70–99)
Potassium: 4.1 mEq/L (ref 3.5–5.1)
Sodium: 139 mEq/L (ref 135–145)

## 2022-03-21 LAB — CBC WITH DIFFERENTIAL/PLATELET
Basophils Absolute: 0 10*3/uL (ref 0.0–0.1)
Basophils Relative: 0.6 % (ref 0.0–3.0)
Eosinophils Absolute: 0.2 10*3/uL (ref 0.0–0.7)
Eosinophils Relative: 3.6 % (ref 0.0–5.0)
HCT: 40.1 % (ref 36.0–46.0)
Hemoglobin: 12.9 g/dL (ref 12.0–15.0)
Lymphocytes Relative: 31.2 % (ref 12.0–46.0)
Lymphs Abs: 1.7 10*3/uL (ref 0.7–4.0)
MCHC: 32.2 g/dL (ref 30.0–36.0)
MCV: 79.3 fl (ref 78.0–100.0)
Monocytes Absolute: 0.5 10*3/uL (ref 0.1–1.0)
Monocytes Relative: 9.8 % (ref 3.0–12.0)
Neutro Abs: 3.1 10*3/uL (ref 1.4–7.7)
Neutrophils Relative %: 54.8 % (ref 43.0–77.0)
Platelets: 280 10*3/uL (ref 150.0–400.0)
RBC: 5.06 Mil/uL (ref 3.87–5.11)
RDW: 15.3 % (ref 11.5–15.5)
WBC: 5.6 10*3/uL (ref 4.0–10.5)

## 2022-03-21 LAB — LIPID PANEL
Cholesterol: 135 mg/dL (ref 0–200)
HDL: 49.8 mg/dL (ref >39.00)
LDL Cholesterol: 72 mg/dL (ref 0–99)
NonHDL: 85.15
Total CHOL/HDL Ratio: 3
Triglycerides: 66 mg/dL (ref 0.0–149.0)
VLDL: 13.2 mg/dL (ref 0.0–40.0)

## 2022-03-21 LAB — HEPATIC FUNCTION PANEL
ALT: 11 U/L (ref 0–35)
AST: 18 U/L (ref 0–37)
Albumin: 4.3 g/dL (ref 3.5–5.2)
Alkaline Phosphatase: 60 U/L (ref 39–117)
Bilirubin, Direct: 0.1 mg/dL (ref 0.0–0.3)
Total Bilirubin: 0.3 mg/dL (ref 0.2–1.2)
Total Protein: 7.2 g/dL (ref 6.0–8.3)

## 2022-03-21 MED ORDER — ELETRIPTAN HYDROBROMIDE 40 MG PO TABS: 40.0000 mg | ORAL_TABLET | ORAL | 5 refills | Status: DC | PRN

## 2022-03-21 NOTE — Progress Notes (Signed)
Established Patient Office Visit  Subjective   Patient ID: [name], adult    DOB: [birth year]  Age: 26 y.o. MRN: [id number]  Chief Complaint  Patient presents with   Annual Exam    HPI   Candice Rowe is seen for physical exam.  She has history of migraine headaches and generally Maxalt has helped but she is recently had some headaches that this is not seem to alleviate.  She would like to explore other options perhaps.  Usually has about 2/month.  She has history of social anxiety and some recurrent depression symptoms.  She is currently stable on bupropion and Lexapro.  Past history of kidney stones.  Currently not exercising regularly.  She has had some intentional weight gain about 14 pounds since last year and feels better overall.  Health maintenance reviewed  -Tetanus up-to-date.  No immunizations due at this time. -Pap smear up-to-date  Family history-for Rowe having type of brain tumor, hypertension, history of stroke.  Father with history of hypertension.  She has 2 sisters who are generally healthy and well.  Social history-she is single.  She currently works at [employer].  Non-smoker.  No regular alcohol.  Past Medical History:  Diagnosis Date   Migraines    Premature birth    pt has a twin, born at 65 weeks   History reviewed. No pertinent surgical history.  reports that Candice Rowe has never smoked. Candice Rowe has never used smokeless tobacco. Candice Rowe reports current alcohol use of about 1.0 standard drink of alcohol per week. Candice Rowe reports that Candice Rowe does not use drugs. family history includes Cancer in [name]; Hyperlipidemia in Candice Rowe's father; Hypertension in [name]. Candice Rowe Rowe; Stroke in Candice Rowe. Allergies  Allergen Reactions   Dust Mite Extract     Watery eyes, sneezing, hives if patient does not wash hands. Also has pet dander allergy     Review of Systems  Constitutional:  Negative for  chills, fever, malaise/fatigue and weight loss.  HENT:  Negative for hearing loss.   Eyes:  Negative for blurred vision and double vision.  Respiratory:  Negative for cough and shortness of breath.   Cardiovascular:  Negative for chest pain, palpitations and leg swelling.  Gastrointestinal:  Negative for abdominal pain, blood in stool, constipation and diarrhea.  Genitourinary:  Negative for dysuria.  Skin:  Negative for rash.  Neurological:  Negative for dizziness, speech change, seizures, loss of consciousness and headaches.  Psychiatric/Behavioral:  Negative for depression.       Objective:     BP 110/80 (BP Location: Left Arm, Patient Position: Sitting, Cuff Size: Normal)   Pulse 90   Temp 97.7 F (36.5 C) (Oral)   Ht 5' 4.96" (1.65 m)   Wt 124 lb 14.4 oz (56.7 kg)   SpO2 98%   BMI 20.81 kg/m    Physical Exam Vitals reviewed.  Constitutional:      Appearance: Candice Rowe is well-developed.  HENT:     Head: Normocephalic and atraumatic.  Eyes:     Pupils: Pupils are equal, round, and reactive to light.  Neck:     Thyroid: No thyromegaly.  Cardiovascular:     Rate and Rhythm: Normal rate and regular rhythm.     Heart sounds: Normal heart sounds. No murmur heard. Pulmonary:     Effort: No respiratory distress.     Breath sounds: Normal breath sounds. No  wheezing or rales.  Abdominal:     General: Bowel sounds are normal. There is no distension.     Palpations: Abdomen is soft. There is no mass.     Tenderness: There is no abdominal tenderness. There is no guarding or rebound.  Musculoskeletal:        General: Normal range of motion.     Cervical back: Normal range of motion and neck supple.     Right lower leg: No edema.     Left lower leg: No edema.  Lymphadenopathy:     Cervical: No cervical adenopathy.  Skin:    Findings: No rash.  Neurological:     Mental Status: Candice Rowe is alert and oriented to person, place, and time.     Cranial Nerves: No  cranial nerve deficit.  Psychiatric:        Behavior: Behavior normal.        Thought Content: Thought content normal.        Judgment: Judgment normal.      No results found for any visits on 03/21/22.    The ASCVD Risk score (Arnett DK, et al., 2019) failed to calculate for the following reasons:   The 2019 ASCVD risk score is only valid for ages 36 to 64    Assessment & Plan:   Problem List Items Addressed This Visit   None Visit Diagnoses     Physical exam    -  Primary   Relevant Orders   Basic metabolic panel   Lipid panel   CBC with Differential/Platelet   Hepatic function panel     -Generally healthy 27 year old female.  We discussed the following issues today  -Encouraged her to try to establish at least 150 minutes/week of moderate intensity aerobic exercise ideally with some resistance training built in as well -Consider annual flu vaccine -We discussed trial of Relpax 40 mg 1 at onset of migraine and may repeat once in 2 hours as needed but no more than 2 in 24 hours and give feedback if not working well -She would be due for repeat Pap smear by next year -Obtain follow-up labs as above  No follow-ups on file.    Carolann Littler, MD

## 2022-05-29 IMAGING — US US PELVIS COMPLETE TRANSABD/TRANSVAG W DUPLEX AND/OR DOPPLER
1 series · 13 of 25 positions shown · non-contrast
Comparison: CT from earlier the same day.

CLINICAL DATA: Initial evaluation for acute right lower quadrant
pain.

EXAM:
TRANSABDOMINAL AND TRANSVAGINAL ULTRASOUND OF PELVIS
DOPPLER ULTRASOUND OF OVARIES
TECHNIQUE: Both transabdominal and transvaginal ultrasound examinations of the
pelvis were performed. Transabdominal technique was performed for
global imaging of the pelvis including uterus, ovaries, adnexal
regions, and pelvic cul-de-sac.
It was necessary to proceed with endovaginal exam following the
transabdominal exam to visualize the endometrium and ovaries. Color
and duplex Doppler ultrasound was utilized to evaluate blood flow to
the ovaries.

[Series 1: us pelvic complete w transvaginal and torsion righ · 13 of 41 slices shown]
[im 1/41]
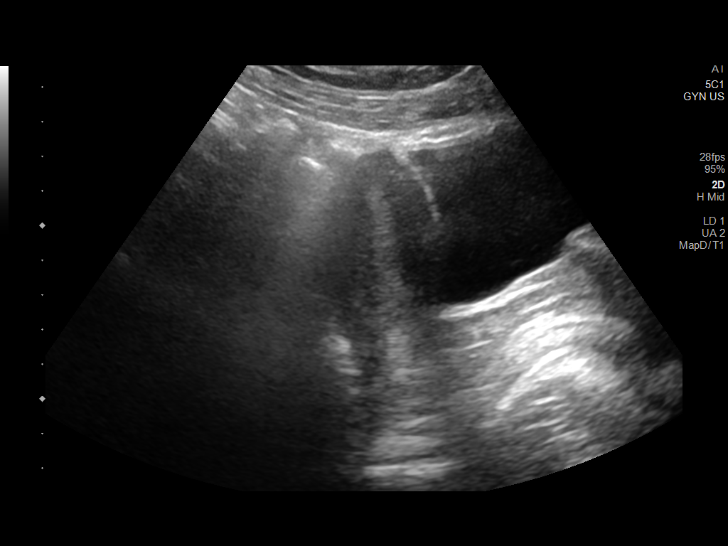
[im 4/41]
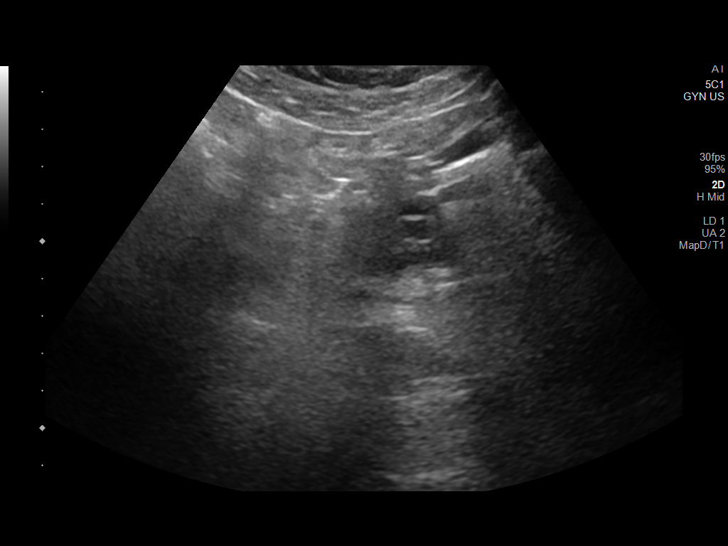
[im 7/41]
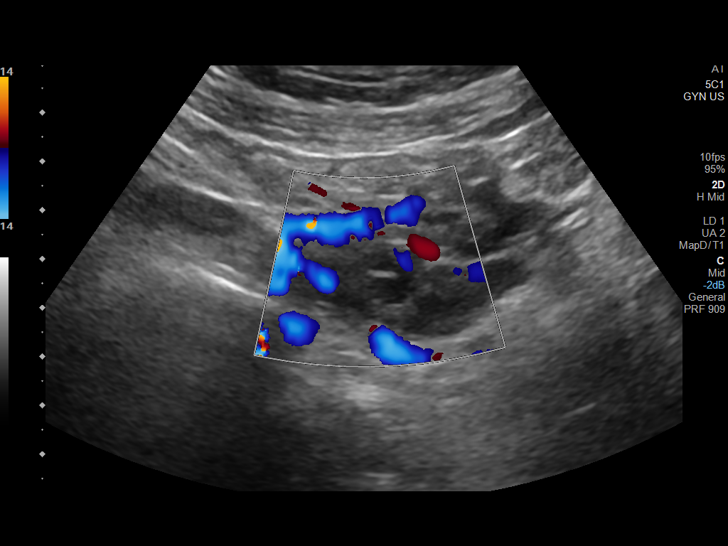
[im 11/41]
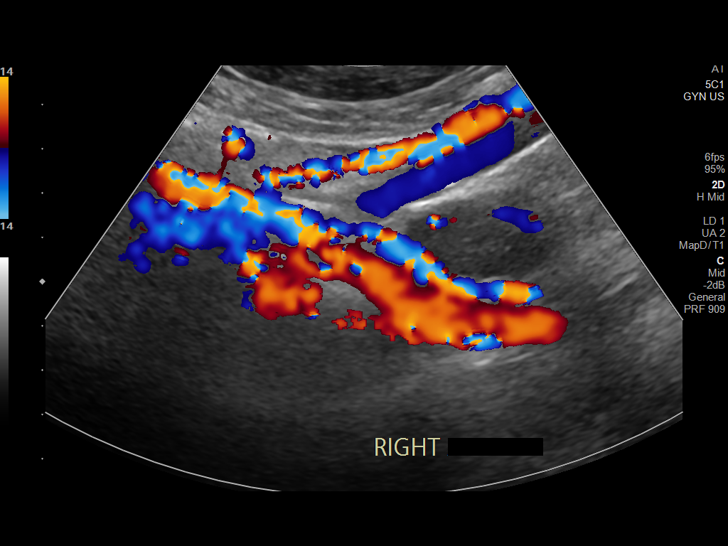
[im 14/41]
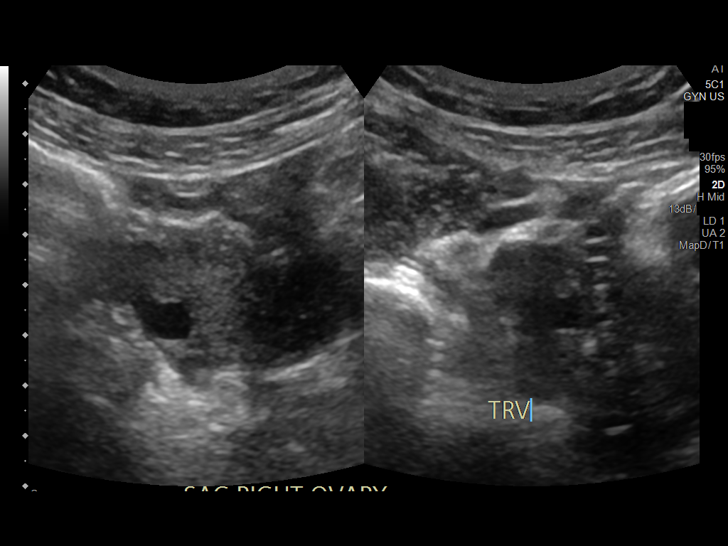
[im 17/41]
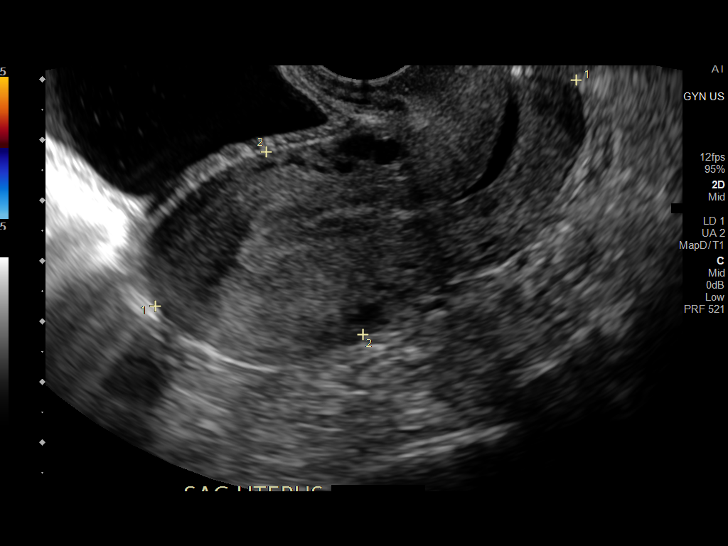
[im 21/41]
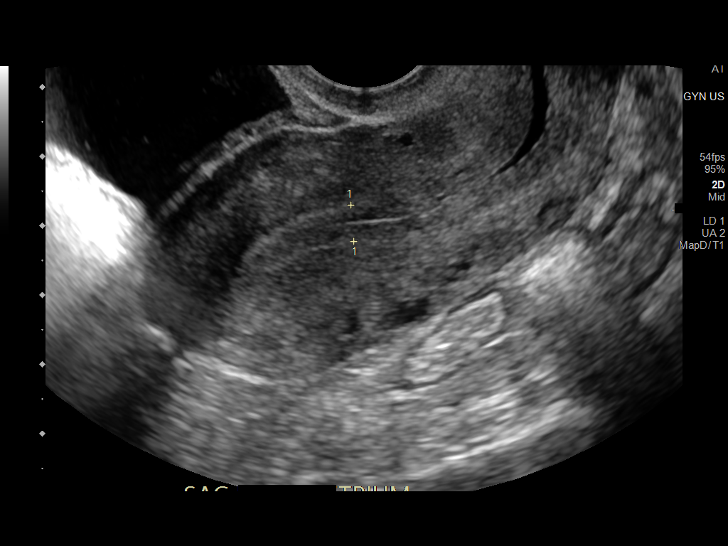
[im 24/41]
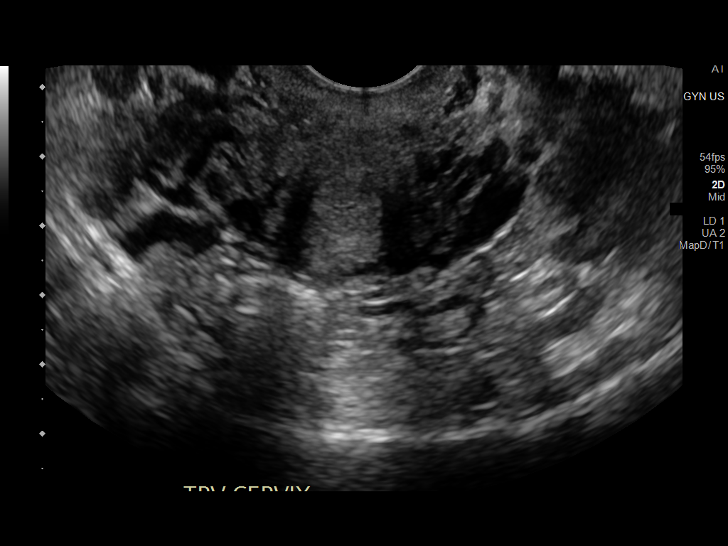
[im 27/41]
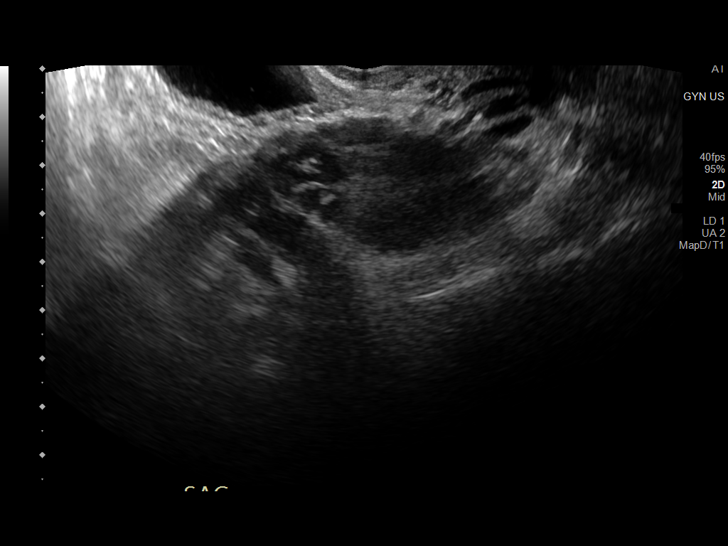
[im 31/41]
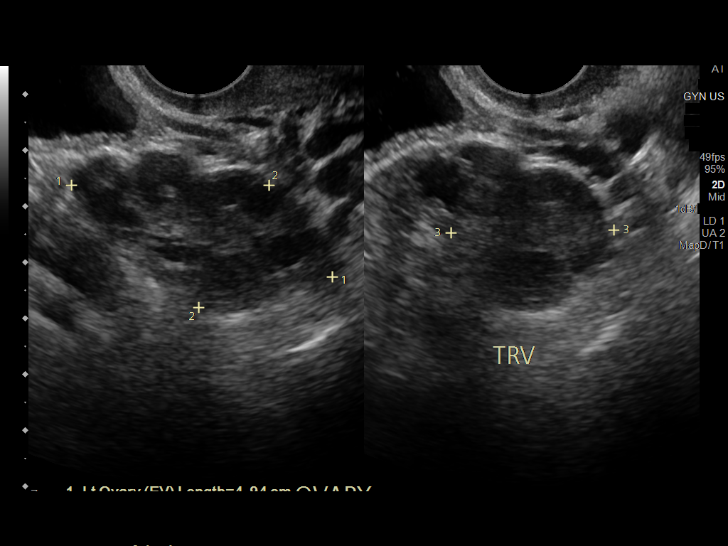
[im 34/41]
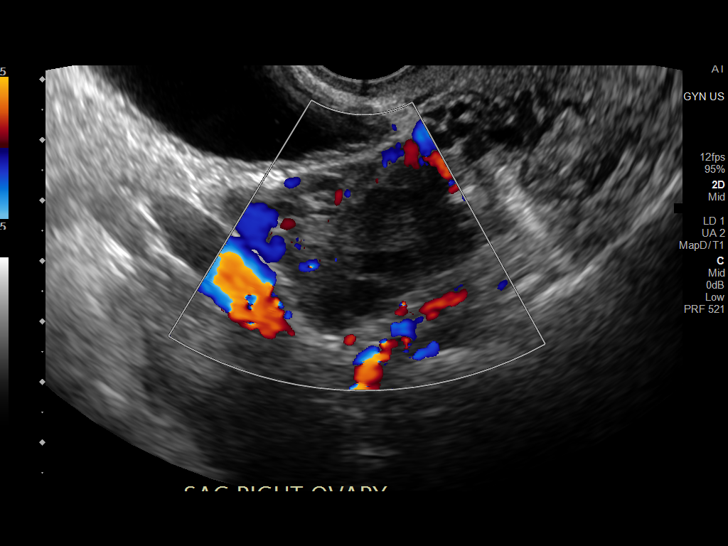
[im 37/41]
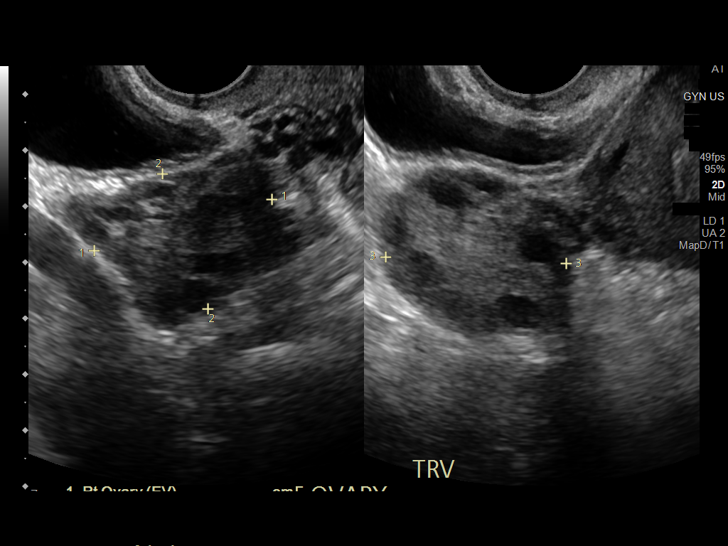
[im 41/41]
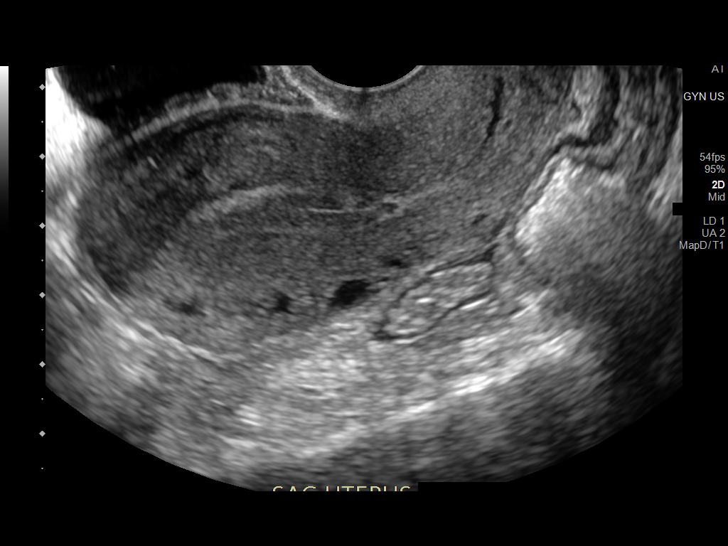

[13 of 25 positions shown; findings below may reference images not displayed]

FINDINGS: Uterus

Measurements: 7.9 x 3.4 x 4.7 cm = volume: 66.4 mL. Uterus is
anteverted. No discrete fibroid or other myometrial abnormality.

Endometrium

Thickness: 5.3 mm. No focal abnormality visualized. Small volume
simple fluid noted within the endocervical canal.

Right ovary

Measurements: 3.3 x 2.5 x 3.2 cm = volume: 14.2 mL. Normal
appearance/no adnexal mass.

Left ovary

Measurements: 4.9 x 2.5 x 2.9 cm = volume: 19.0 mL. Normal
appearance/no adnexal mass.

Pulsed Doppler evaluation of both ovaries demonstrates normal
low-resistance arterial and venous waveforms.

Other findings

No abnormal free fluid.
IMPRESSION: Normal pelvic ultrasound. No evidence for torsion or other acute
abnormality.

## 2022-05-29 IMAGING — CT CT ABD-PELV W/ CM
2 of 4 series · 16 of 46 positions shown, 18 images · IV contrast (agent unspecified)
Comparison: August 16, 2016

CLINICAL DATA: Right lower quadrant pain.

EXAM:
CT ABDOMEN AND PELVIS WITH CONTRAST
TECHNIQUE: Multidetector CT imaging of the abdomen and pelvis was performed
using the standard protocol following bolus administration of
intravenous contrast.

[Series 3: a/p w/ 5mm · axial · 0.78mm/px · z∈[+420,+800]mm · 13 of 84 slices shown, 15 images]
[im 4/84  soft-tissue]
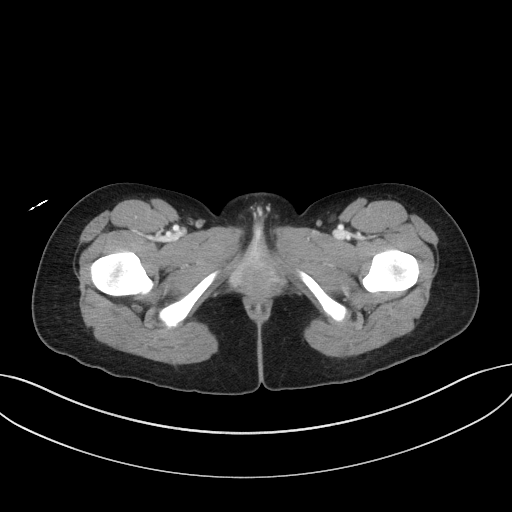
[im 4/84  bone]
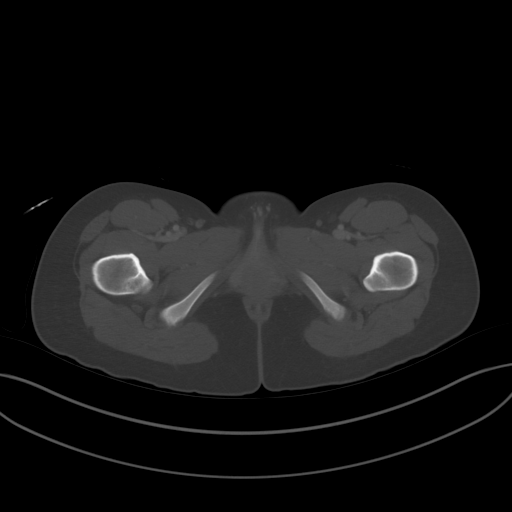
[im 11/84  soft-tissue]
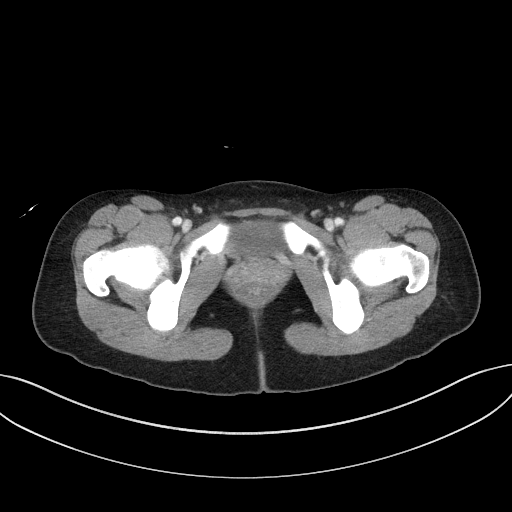
[im 18/84  soft-tissue]
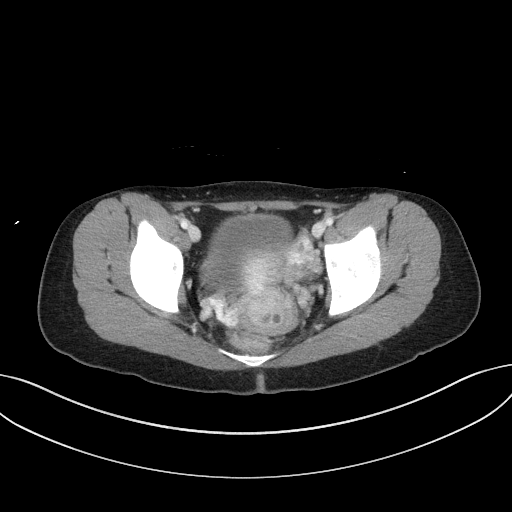
[im 25/84  soft-tissue]
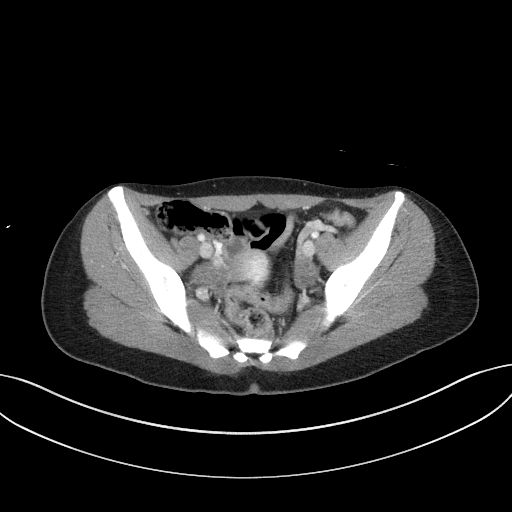
[im 28/84  soft-tissue]
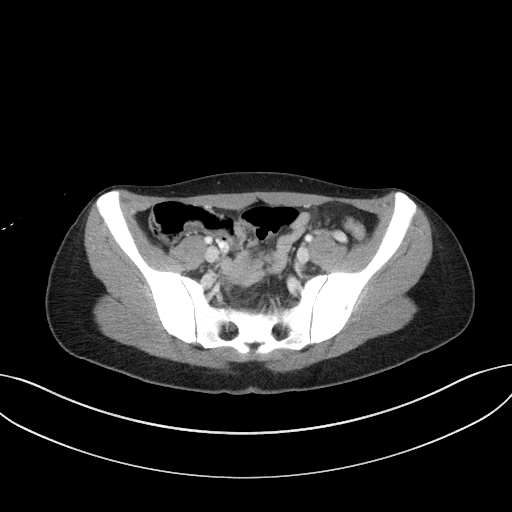
[im 35/84  soft-tissue]
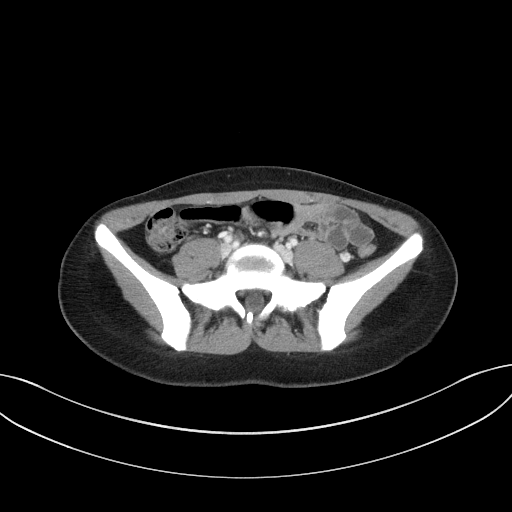
[im 42/84  soft-tissue]
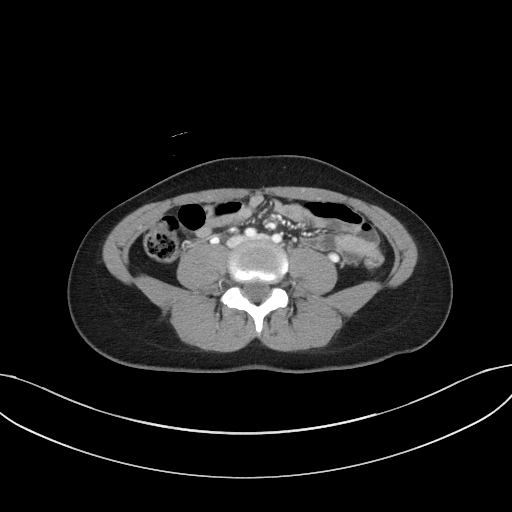
[im 49/84  soft-tissue]
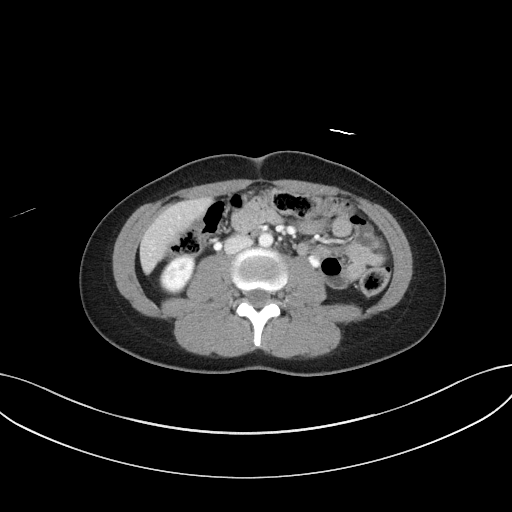
[im 56/84  soft-tissue]
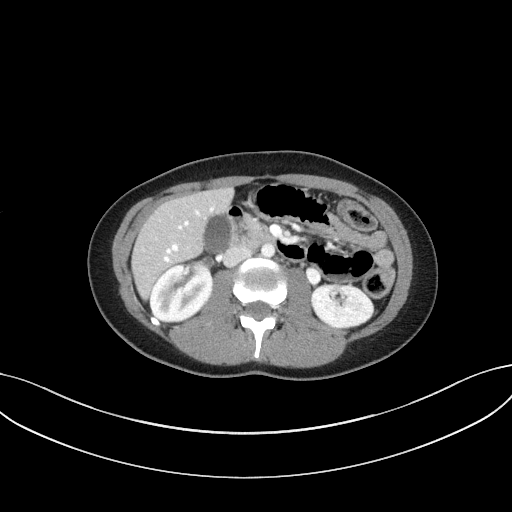
[im 56/84  bone]
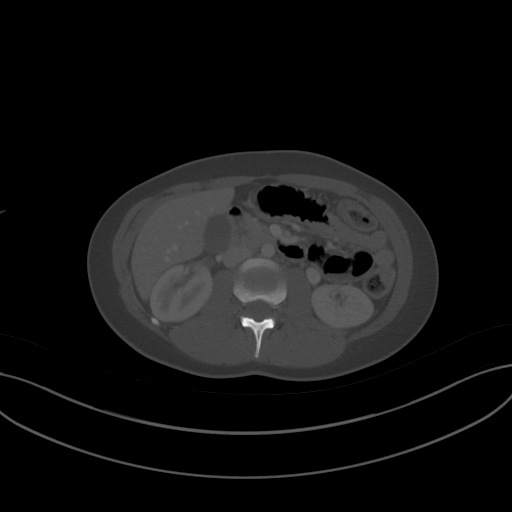
[im 59/84  soft-tissue]
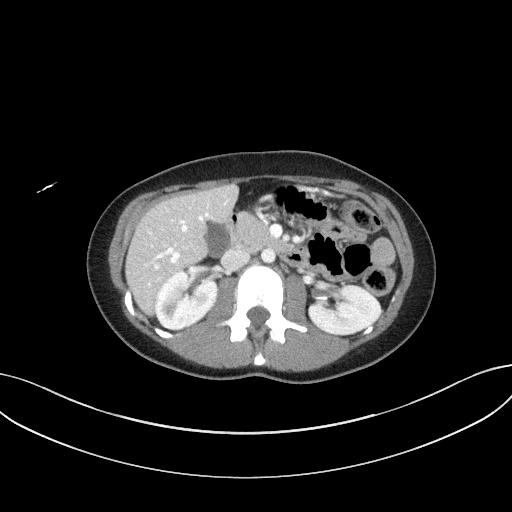
[im 66/84  soft-tissue]
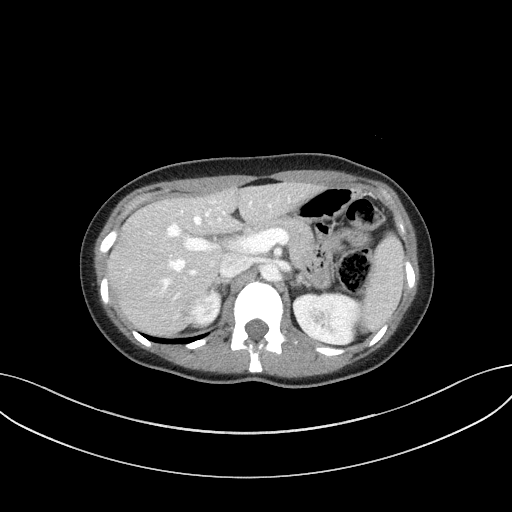
[im 73/84  soft-tissue]
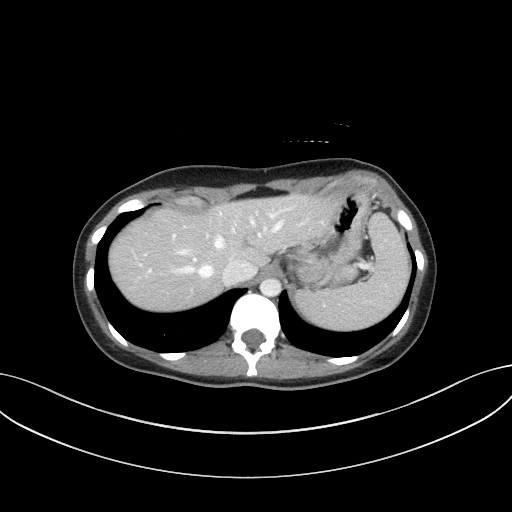
[im 80/84  soft-tissue]
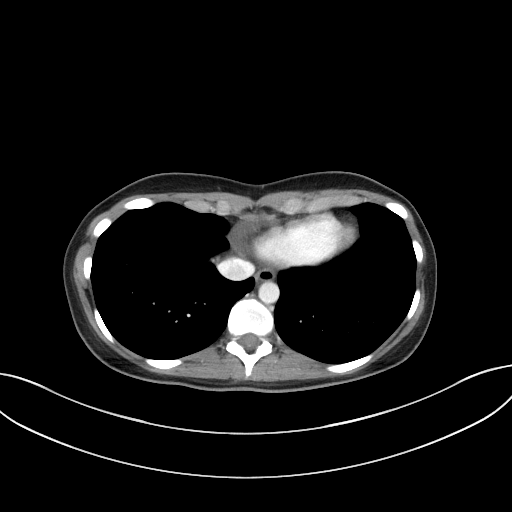

[Series 6: a/p w/ cor · coronal · 0.75mm/px · 3 of 112 slices shown]
[im 38/112  soft-tissue]
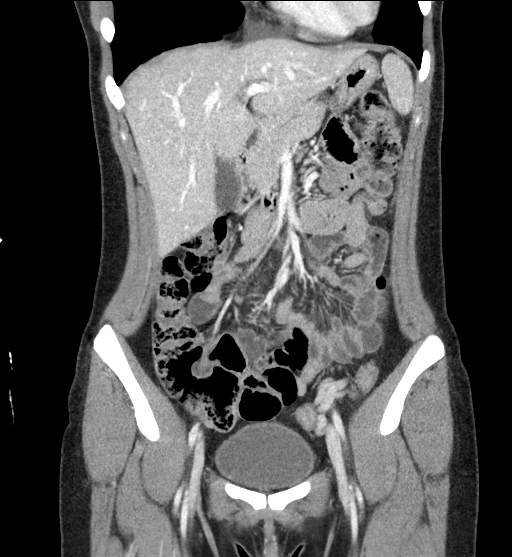
[im 50/112  soft-tissue]
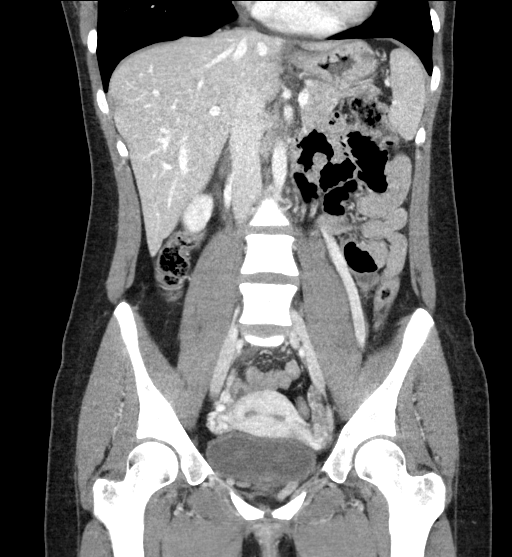
[im 62/112  soft-tissue]
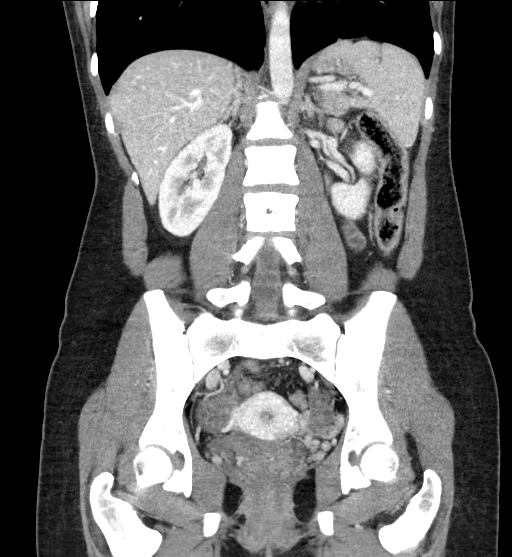

[16 of 46 positions shown; findings below may reference images not displayed]

RADIATION DOSE REDUCTION: This exam was performed according to the
departmental dose-optimization program which includes automated
exposure control, adjustment of the mA and/or kV according to
patient size and/or use of iterative reconstruction technique.

CONTRAST:  100mL OMNIPAQUE IOHEXOL 300 MG/ML  SOLN
FINDINGS: Lower chest: No acute abnormality.

Hepatobiliary: No focal liver abnormality is seen. No gallstones,
gallbladder wall thickening, or biliary dilatation.

Pancreas: Unremarkable. No pancreatic ductal dilatation or
surrounding inflammatory changes.

Spleen: Normal in size without focal abnormality.

Adrenals/Urinary Tract: Adrenal glands are unremarkable. Kidneys are
normal, without renal calculi, focal lesion, or hydronephrosis.
Bladder is unremarkable.

Stomach/Bowel: Stomach is within normal limits. Appendix appears
normal. No evidence of bowel wall thickening, distention, or
inflammatory changes.

Vascular/Lymphatic: No significant vascular findings are present. No
enlarged abdominal or pelvic lymph nodes.

Reproductive: A 6 mm diameter fluid-filled tubular appearing
structure of unclear clinical significance is seen within the cervix
on the left (axial CT images 64 through 68/coronal re-formatted
image 74, CT series 6). Thin, tortuous vessels are seen along the
lateral aspects of an otherwise normal appearing uterus. The
bilateral adnexa are unremarkable.

Other: No abdominal wall hernia or abnormality. No abdominopelvic
ascites.

Musculoskeletal: No acute or significant osseous findings.
IMPRESSION: No acute or active process within the abdomen or pelvis.

## 2022-06-20 ENCOUNTER — Other Ambulatory Visit: Payer: Self-pay | Admitting: Family Medicine

## 2022-08-04 ENCOUNTER — Other Ambulatory Visit: Payer: Self-pay | Admitting: Family Medicine

## 2022-08-29 ENCOUNTER — Encounter: Payer: Self-pay | Admitting: Podiatry

## 2022-08-29 ENCOUNTER — Ambulatory Visit: Payer: BC Managed Care – PPO | Admitting: Podiatry

## 2022-08-29 DIAGNOSIS — B351 Tinea unguium: Secondary | ICD-10-CM | POA: Diagnosis not present

## 2022-08-29 NOTE — Progress Notes (Signed)
   Chief Complaint  Patient presents with   Nail Problem    Hallux bilateral - thick, discolored x years, in 2022 had nails removed but they grew back still just as bad, was told to try the urea nail cream, but hasn't done that yet   New Patient (Initial Visit)    Est pt 2022    Subjective: 27 y.o. adult presenting today for follow-up evaluation of nail dystrophy to the bilateral toenails.  This has been ongoing for a few years now.  We have tried different conservative modalities to the toenails as well as total temporary nail avulsion.  There is been no improvement.  The nails were removed in 2022 but they grew back completely dystrophic and discolored.  Presenting for further treatment evaluation  Past Medical History:  Diagnosis Date   Migraines    Premature birth    pt has a twin, born at 36 weeks    No past surgical history on file.  Allergies  Allergen Reactions   Dust Mite Extract     Watery eyes, sneezing, hives if patient does not wash hands. Also has pet dander allergy   Nail biopsy pathology report 03/14/2018: - Pathologic nailbed keratinization - Subungual bacterial colonization - Onychoschizia (pathologic splitting of nail plate) - PAS reaction and GMS stains failed to demonstrate fungal elements - A Fontana-Masson stain fails to demonstrate melanin pigment   B/L toes 08/29/2022  Objective: Physical Exam General: The patient is alert and oriented x3 in no acute distress.  Dermatology: Hyperkeratotic, discolored, thickened, onychodystrophy noted. Skin is warm, dry and supple bilateral lower extremities. Negative for open lesions or macerations.  Vascular: Palpable pedal pulses bilaterally. No edema or erythema noted. Capillary refill within normal limits.  Neurological: Grossly intact via light touch  Musculoskeletal Exam: No pedal deformity noted  Assessment: #1 Onychomycosis of toenails  Plan of Care:  -Patient was evaluated. -Although original nail  biopsy cultures 03/14/2018 were negative for fungus, I do believe there is a fungal element to her toenails.  We will attempt to treat the patient for toenail fungus to see if this helps the improvement of her nails.  Patient would like to pursue this option and agrees -Today we discussed different treatment options including oral, topical, and laser antifungal treatment modalities.  We discussed their efficacies and side effects.  Patient opts for oral antifungal treatment modality -Prescription for Lamisil 250 mg #90 daily. Pt denies a history of liver pathology or symptoms.  Hepatic function panel 03/21/2022 WNL -At the end of the 90 days we will plan for an updated hepatic function panel and refill of an additional 90 days of Lamisil if her hepatic function is WNL -Return to clinic 6 months   Felecia Shelling, DPM [facility]  Dr. Felecia Shelling, DPM    [address] [place], [place] [address]                Office [phone]  Fax [phone]

## 2022-09-06 ENCOUNTER — Encounter: Payer: Self-pay | Admitting: Podiatry

## 2022-09-22 ENCOUNTER — Other Ambulatory Visit: Payer: Self-pay | Admitting: Podiatry

## 2022-09-22 MED ORDER — TERBINAFINE HCL 250 MG PO TABS
250.0000 mg | ORAL_TABLET | Freq: Every day | ORAL | 0 refills | Status: DC
Start: 2022-09-22 — End: 2023-03-06

## 2022-11-03 ENCOUNTER — Other Ambulatory Visit: Payer: Self-pay | Admitting: Family Medicine

## 2023-01-06 ENCOUNTER — Other Ambulatory Visit: Payer: Self-pay | Admitting: Family Medicine

## 2023-02-03 ENCOUNTER — Other Ambulatory Visit: Payer: Self-pay | Admitting: Family Medicine

## 2023-03-06 ENCOUNTER — Encounter: Payer: Self-pay | Admitting: Podiatry

## 2023-03-06 ENCOUNTER — Ambulatory Visit: Payer: 59 | Admitting: Podiatry

## 2023-03-06 DIAGNOSIS — B351 Tinea unguium: Secondary | ICD-10-CM

## 2023-03-06 DIAGNOSIS — Z79899 Other long term (current) drug therapy: Secondary | ICD-10-CM

## 2023-03-06 MED ORDER — TERBINAFINE HCL 250 MG PO TABS: 250.0000 mg | ORAL_TABLET | Freq: Every day | ORAL | 0 refills | Status: AC

## 2023-03-06 NOTE — Progress Notes (Signed)
   No chief complaint on file.   Subjective: 28 y.o. adult presenting today for follow-up evaluation of nail dystrophy to the bilateral toenails.  This has been ongoing for a few years now.  We have tried different conservative modalities to the toenails as well as total temporary nail avulsion.  There is been no improvement.  The nails were removed in 2022 but they grew back completely dystrophic and discolored.    Patient states that the last prescription of Lamisil she was not very consistent with it.  She has not noticed much improvement since last visit  Past Medical History:  Diagnosis Date   Migraines    Premature birth    pt has a twin, born at 74 weeks    No past surgical history on file.  Allergies  Allergen Reactions   Dust Mite Extract     Watery eyes, sneezing, hives if patient does not wash hands. Also has pet dander allergy   Nail biopsy pathology report 03/14/2018: - Pathologic nailbed keratinization - Subungual bacterial colonization - Onychoschizia (pathologic splitting of nail plate) - PAS reaction and GMS stains failed to demonstrate fungal elements - A Fontana-Masson stain fails to demonstrate melanin pigment   B/L toes 08/29/2022  Objective: Physical Exam General: The patient is alert and oriented x3 in no acute distress.  Dermatology: Hyperkeratotic, discolored, thickened, onychodystrophy noted. Skin is warm, dry and supple bilateral lower extremities. Negative for open lesions or macerations.  Vascular: Palpable pedal pulses bilaterally. No edema or erythema noted. Capillary refill within normal limits.  Neurological: Grossly intact via light touch  Musculoskeletal Exam: No pedal deformity noted  Assessment: #1 Onychomycosis/nail dystrophy of bilateral great toenails  Plan of Care:  -Patient was evaluated. - Mechanical debridement of the toenails was performed today using a nail nipper without incident or bleeding. - Refill prescription for  Lamisil 250 mg #90 daily. Pt denies a history of liver pathology or symptoms.   -New order was placed for hepatic function panel.  Will contact the patient if labs are abnormal -At the end of the 90 days we will plan for an updated hepatic function panel and refill of an additional 90 days of Lamisil if her hepatic function is WNL -Return to clinic 6 months  *She is contemplating moving to [place] to live with her grandfather and needs care.  Her Twin sister getting married May 2025 and candidate.    Felecia Shelling, DPM [facility]  Dr. Felecia Shelling, DPM    [address] [place], [place] [address]                Office [phone]  Fax [phone]

## 2023-03-22 ENCOUNTER — Ambulatory Visit (INDEPENDENT_AMBULATORY_CARE_PROVIDER_SITE_OTHER): Payer: 59 | Admitting: Family Medicine

## 2023-03-22 ENCOUNTER — Encounter: Payer: Self-pay | Admitting: Family Medicine

## 2023-03-22 VITALS — BP 116/70 | HR 80 | Temp 98.2°F | Ht 64.96 in | Wt 135.7 lb

## 2023-03-22 DIAGNOSIS — Z0000 Encounter for general adult medical examination without abnormal findings: Secondary | ICD-10-CM | POA: Diagnosis not present

## 2023-03-22 LAB — BASIC METABOLIC PANEL
BUN: 12 mg/dL (ref 6–23)
CO2: 28 meq/L (ref 19–32)
Calcium: 9.3 mg/dL (ref 8.4–10.5)
Chloride: 105 meq/L (ref 96–112)
Creatinine, Ser: 0.71 mg/dL (ref 0.40–1.20)
GFR: 115.44 mL/min (ref >60.00)
Glucose, Bld: 93 mg/dL (ref 70–99)
Potassium: 4.1 meq/L (ref 3.5–5.1)
Sodium: 139 meq/L (ref 135–145)

## 2023-03-22 LAB — CBC WITH DIFFERENTIAL/PLATELET
Basophils Absolute: 0 10*3/uL (ref 0.0–0.1)
Basophils Relative: 0.9 % (ref 0.0–3.0)
Eosinophils Absolute: 0.1 10*3/uL (ref 0.0–0.7)
Eosinophils Relative: 2.5 % (ref 0.0–5.0)
HCT: 39.8 % (ref 36.0–46.0)
Hemoglobin: 13 g/dL (ref 12.0–15.0)
Lymphocytes Relative: 31.8 % (ref 12.0–46.0)
Lymphs Abs: 1.4 10*3/uL (ref 0.7–4.0)
MCHC: 32.6 g/dL (ref 30.0–36.0)
MCV: 82.5 fl (ref 78.0–100.0)
Monocytes Absolute: 0.3 10*3/uL (ref 0.1–1.0)
Monocytes Relative: 7.5 % (ref 3.0–12.0)
Neutro Abs: 2.6 10*3/uL (ref 1.4–7.7)
Neutrophils Relative %: 57.3 % (ref 43.0–77.0)
Platelets: 262 10*3/uL (ref 150.0–400.0)
RBC: 4.82 Mil/uL (ref 3.87–5.11)
RDW: 14.2 % (ref 11.5–15.5)
WBC: 4.5 10*3/uL (ref 4.0–10.5)

## 2023-03-22 LAB — HEPATIC FUNCTION PANEL
ALT: 14 U/L (ref 0–35)
AST: 18 U/L (ref 0–37)
Albumin: 4.5 g/dL (ref 3.5–5.2)
Alkaline Phosphatase: 69 U/L (ref 39–117)
Bilirubin, Direct: 0 mg/dL (ref 0.0–0.3)
Total Bilirubin: 0.4 mg/dL (ref 0.2–1.2)
Total Protein: 7.3 g/dL (ref 6.0–8.3)

## 2023-03-22 LAB — LIPID PANEL
Cholesterol: 137 mg/dL (ref 0–200)
HDL: 43.6 mg/dL (ref >39.00)
LDL Cholesterol: 79 mg/dL (ref 0–99)
NonHDL: 93.1
Total CHOL/HDL Ratio: 3
Triglycerides: 69 mg/dL (ref 0.0–149.0)
VLDL: 13.8 mg/dL (ref 0.0–40.0)

## 2023-03-22 MED ORDER — BUPROPION HCL ER (XL) 150 MG PO TB24: 150.0000 mg | ORAL_TABLET | Freq: Every day | ORAL | 3 refills | Status: DC

## 2023-03-22 MED ORDER — ESCITALOPRAM OXALATE 10 MG PO TABS: 10.0000 mg | ORAL_TABLET | Freq: Every day | ORAL | 3 refills | Status: AC

## 2023-03-22 NOTE — Progress Notes (Signed)
 Established Patient Office Visit  Subjective   Patient ID: Kinnedy Mongiello, adult    DOB: Apr 09, 1995  Age: 28 y.o. MRN: [id number]  No chief complaint on file.   HPI    Aliece is seen today for a well visit/physical exam.  Generally doing well.  She has history of social anxiety and some depression currently stable on Lexapro and Wellbutrin.  Does need refills.  She may be moving to [place] soon to help care for her paternal grandfather..  She has seen podiatrist and apparently being treated for onychomycosis.  She is taking Lamisil and needs follow-up liver panel.  Health maintenance reviewed:  Health Maintenance  Topic Date Due   HIV Screening  Never done   COVID-19 Vaccine (3 - Moderna risk series) 03/19/2019   Cervical Cancer Screening (Pap smear)  11/14/2022   INFLUENZA VACCINE  04/10/2023 (Originally 08/11/2022)   DTaP/Tdap/Td (2 - Td or Tdap) 03/20/2031   Hepatitis C Screening  Completed   HPV VACCINES  Aged Out   -She is fairly certain that she did get a flu vaccine this fall although we do not have records. -Last Pap smear was estimated over 3 years ago.  She plans to establish with GYN soon to get follow-up.  No history of abnormal Pap smear.  No history of HPV.  Not sexually active.  Family history-mother with history of brain tumor, hypertension, stroke.  Father has hypertension and history of CAD.  2 sisters alive and well.  Social history-she is single.  Not sexually active.  Works at [employer].  May be moving to [place] soon to care for her grandfather, as above.  Non-smoker.  Rare alcohol.  Past Medical History:  Diagnosis Date   Migraines    Premature birth    pt has a twin, born at 79 weeks   History reviewed. No pertinent surgical history.  reports that Kameah has never smoked. Tereasa has never used smokeless tobacco. Roopa reports current alcohol use of about 1.0 standard drink of alcohol per week. Caasi reports that Xitlalic does not use drugs. family  history includes Cancer in Pranavi's mother; Hyperlipidemia in Payslee's father; Hypertension in Sonora's mother; Stroke in Sutton's mother. Allergies  Allergen Reactions   Dust Mite Extract     Watery eyes, sneezing, hives if patient does not wash hands. Also has pet dander allergy     Review of Systems  Constitutional:  Negative for chills, fever, malaise/fatigue and weight loss.  HENT:  Negative for hearing loss.   Eyes:  Negative for blurred vision and double vision.  Respiratory:  Negative for cough and shortness of breath.   Cardiovascular:  Negative for chest pain, palpitations and leg swelling.  Gastrointestinal:  Negative for abdominal pain, blood in stool, constipation and diarrhea.  Genitourinary:  Negative for dysuria.  Skin:  Negative for rash.  Neurological:  Negative for dizziness, speech change, seizures, loss of consciousness and headaches.  Psychiatric/Behavioral:  Negative for depression.       Objective:     BP 116/70 (BP Location: Left Arm, Patient Position: Sitting, Cuff Size: Normal)   Pulse 80   Temp 98.2 F (36.8 C) (Oral)   Ht 5' 4.96" (1.65 m)   Wt 135 lb 11.2 oz (61.6 kg)   LMP  (LMP Unknown)   SpO2 97%   BMI 22.61 kg/m  BP Readings from Last 3 Encounters:  03/22/23 116/70  03/21/22 110/80  04/28/21 94/60   Wt Readings from Last 3 Encounters:  03/22/23  135 lb 11.2 oz (61.6 kg)  03/21/22 124 lb 14.4 oz (56.7 kg)  04/28/21 112 lb 6.4 oz (51 kg)      Physical Exam Vitals reviewed.  Constitutional:      General: Asante is not in acute distress.    Appearance: Aleaya is well-developed. Shanyah is not ill-appearing.  HENT:     Head: Normocephalic and atraumatic.  Eyes:     Pupils: Pupils are equal, round, and reactive to light.  Neck:     Thyroid: No thyromegaly.  Cardiovascular:     Rate and Rhythm: Normal rate and regular rhythm.     Heart sounds: Normal heart sounds. No murmur heard. Pulmonary:     Effort: No respiratory distress.     Breath  sounds: Normal breath sounds. No wheezing or rales.  Abdominal:     General: Bowel sounds are normal. There is no distension.     Palpations: Abdomen is soft. There is no mass.     Tenderness: There is no abdominal tenderness. There is no guarding or rebound.  Musculoskeletal:        General: Normal range of motion.     Cervical back: Normal range of motion and neck supple.     Right lower leg: No edema.     Left lower leg: No edema.  Lymphadenopathy:     Cervical: No cervical adenopathy.  Skin:    Findings: No rash.  Neurological:     Mental Status: Lether is alert and oriented to person, place, and time.     Cranial Nerves: No cranial nerve deficit.  Psychiatric:        Behavior: Behavior normal.        Thought Content: Thought content normal.        Judgment: Judgment normal.      No results found for any visits on 03/22/23.    The ASCVD Risk score (Arnett DK, et al., 2019) failed to calculate for the following reasons:   The 2019 ASCVD risk score is only valid for ages 95 to 74    Assessment & Plan:   Problem List Items Addressed This Visit   None Visit Diagnoses       Physical exam    -  Primary   Relevant Orders   Basic metabolic panel   Lipid panel   CBC with Differential/Platelet   Hepatic function panel     Generally healthy 28 year old female.  She has history of some chronic anxiety and depression currently stable on Lexapro and Wellbutrin.  Refills provided for 1 year. -Obtain follow-up labs as above -Discussed American Heart Association recommendations for minimum 150 minutes of moderate intensity exercise per week.  Try to establish more weightbearing exercise. -Continue annual flu vaccine -Tetanus up-to-date -Patient plans to establish soon with GYN for follow-up Pap smear, though she is low risk.  No follow-ups on file.    Evelena Peat, MD

## 2023-04-10 ENCOUNTER — Other Ambulatory Visit: Payer: Self-pay | Admitting: Family Medicine

## 2023-05-07 ENCOUNTER — Other Ambulatory Visit: Payer: Self-pay | Admitting: Family Medicine

## 2023-06-29 ENCOUNTER — Other Ambulatory Visit: Payer: Self-pay | Admitting: Family Medicine

## 2024-03-22 ENCOUNTER — Encounter: Admitting: Family Medicine
